# Patient Record
Sex: Female | Born: 1980 | Race: White | Hispanic: No | Marital: Married | Smoking: Former smoker
Health system: Southern US, Academic
[De-identification: ages and names within clinical notes are randomized; demographics above are authoritative.]

## PROBLEM LIST (undated history)

## (undated) DIAGNOSIS — F419 Anxiety disorder, unspecified: Secondary | ICD-10-CM

## (undated) DIAGNOSIS — J329 Chronic sinusitis, unspecified: Secondary | ICD-10-CM

## (undated) DIAGNOSIS — F909 Attention-deficit hyperactivity disorder, unspecified type: Secondary | ICD-10-CM

## (undated) DIAGNOSIS — F32A Depression, unspecified: Secondary | ICD-10-CM

## (undated) DIAGNOSIS — G35 Multiple sclerosis: Secondary | ICD-10-CM

## (undated) DIAGNOSIS — G43909 Migraine, unspecified, not intractable, without status migrainosus: Secondary | ICD-10-CM

## (undated) DIAGNOSIS — G35D Multiple sclerosis, unspecified: Secondary | ICD-10-CM

## (undated) HISTORY — PX: HX TONSILLECTOMY: SHX27

## (undated) HISTORY — DX: Multiple sclerosis, unspecified: G35.D

## (undated) HISTORY — DX: Anxiety disorder, unspecified: F41.9

## (undated) HISTORY — PX: SINUS SURGERY: SHX187

## (undated) HISTORY — DX: Migraine, unspecified, not intractable, without status migrainosus: G43.909

## (undated) HISTORY — DX: Multiple sclerosis: G35

## (undated) HISTORY — DX: Depression, unspecified: F32.A

## (undated) HISTORY — DX: Chronic sinusitis, unspecified: J32.9

## (undated) HISTORY — PX: HX HIP REPLACEMENT: SHX124

## (undated) HISTORY — PX: HX GALL BLADDER SURGERY/CHOLE: SHX55

## (undated) HISTORY — DX: Attention-deficit hyperactivity disorder, unspecified type: F90.9

---
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
## 2018-11-08 IMAGING — MR MRI BRAIN WITHOUT AND WITH CONTRAST
10 of 14 series · 34 of 48 positions shown · IV contrast (gadavist)
Comparison: None available.
COMPARISON: None available.

------------- REPORT [id number] -------------
Addendum Begin

Comparison has been made to a brain MRI report dated 05/06/2010. Previously reported 7 mm cyst deep to the insula on the left side is stable and is most consistent with a prominent perivascular space.
Addendum End
EXAM:  MRI BRAIN WITHOUT AND WITH CONTRAST
INDICATION: Headaches and visual disturbance, evaluate for multiple sclerosis.
TECHNIQUE: Multiplanar multisequential MRI of the brain was performed without and with 6 mL of Gadavist.

[Series 10: DWI · axial · 5.0mm · 1.35mm/px · z∈[-14,+112]mm · 8 of 88 slices shown (1 of 3)]
[im 1/88]
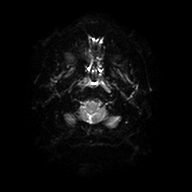
[im 10/88]
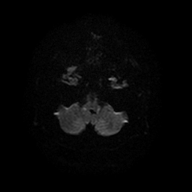
[im 30/88]
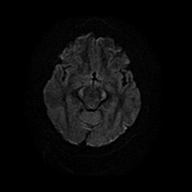
[im 39/88]
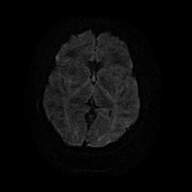
[im 49/88]
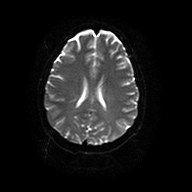
[im 59/88]
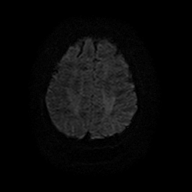
[im 78/88]
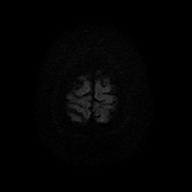
[im 88/88]
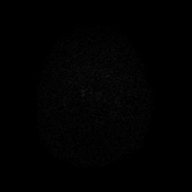

[Series 11: DWI · axial · 5.0mm · 1.35mm/px · z∈[-14,+112]mm · 3 of 22 slices shown (2 of 3)]
[im 1/22]
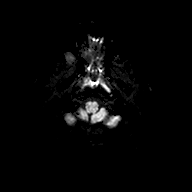
[im 11/22]
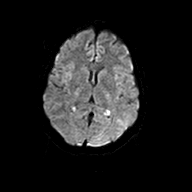
[im 22/22]
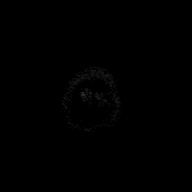

[Series 12: DWI · axial · 5.0mm · 1.35mm/px · z∈[-14,+112]mm · 2 of 22 slices shown (3 of 3)]
[im 1/22]
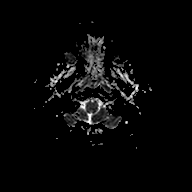
[im 22/22]
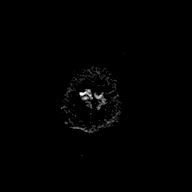

[Series 13: FLAIR · sagittal · 5.0mm · 0.75mm/px · 3 of 28 slices shown (1 of 2)]
[im 1/28]
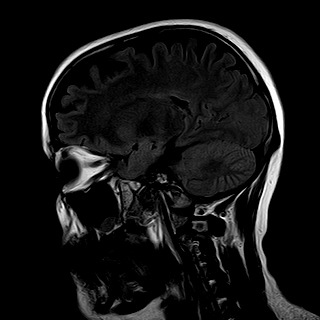
[im 14/28]
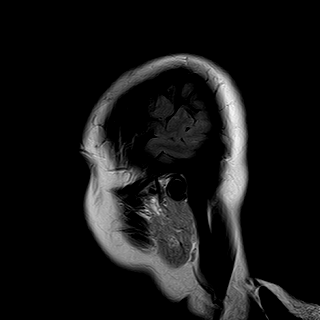
[im 28/28]
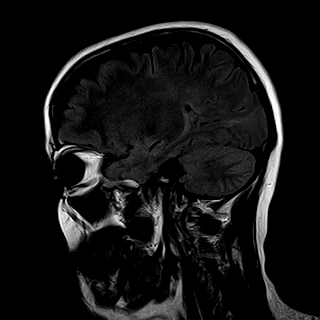

[Series 14: FLAIR · axial · 4.0mm · 0.43mm/px · z∈[-29,+125]mm · 3 of 36 slices shown (2 of 2)]
[im 1/36]
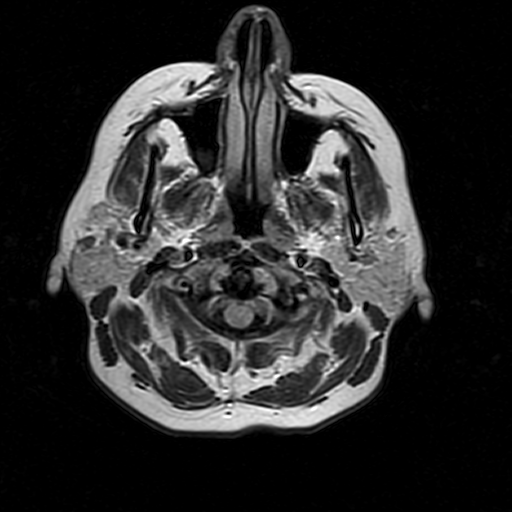
[im 18/36]
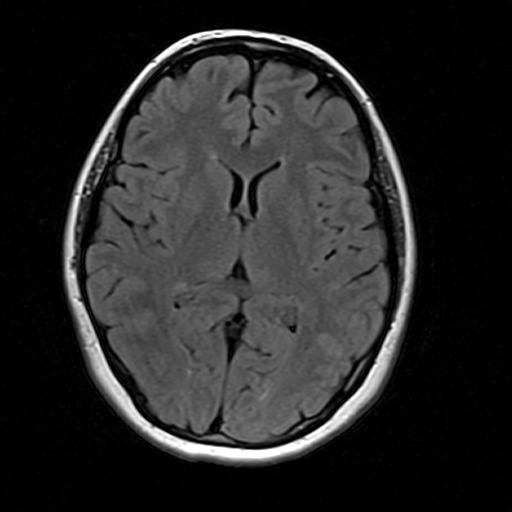
[im 36/36]
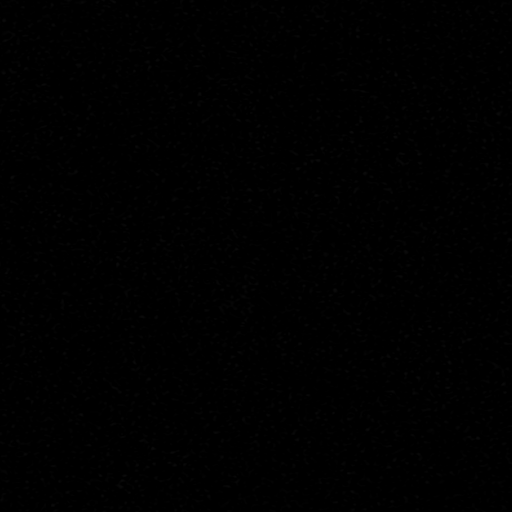

[Series 18: T1 · axial · 4.0mm · 0.43mm/px · z∈[-29,+125]mm · 3 of 36 slices shown]
[im 1/36]
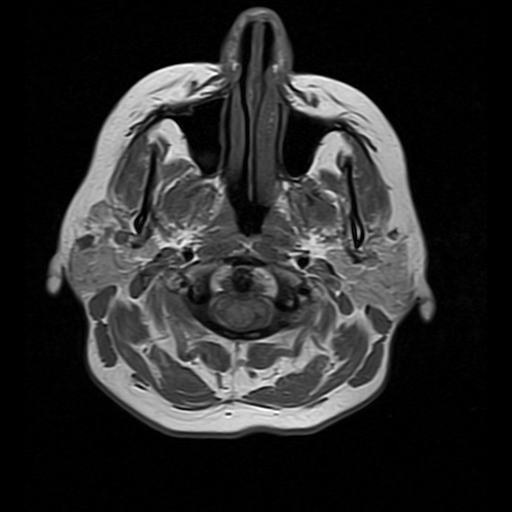
[im 18/36]
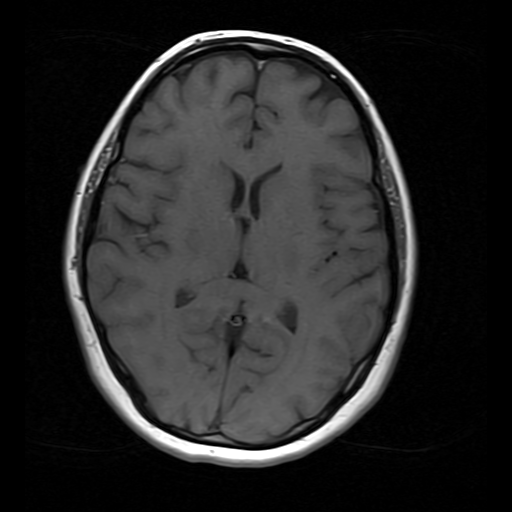
[im 36/36]
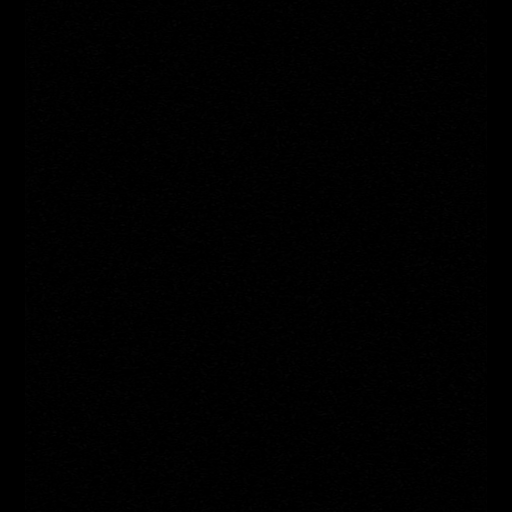

[Series 19: T2 · coronal · 5.0mm · 0.43mm/px · 3 of 28 slices shown]
[im 1/28]
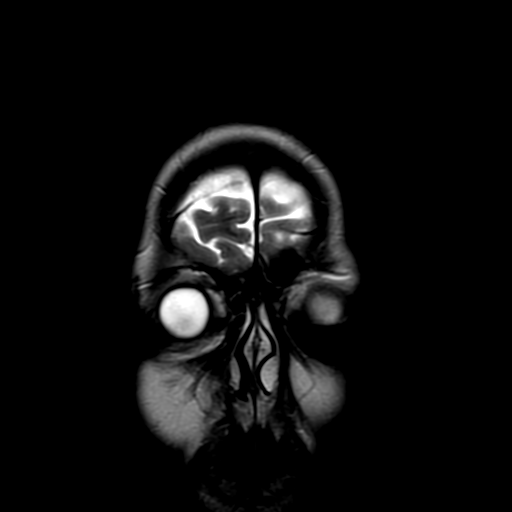
[im 14/28]
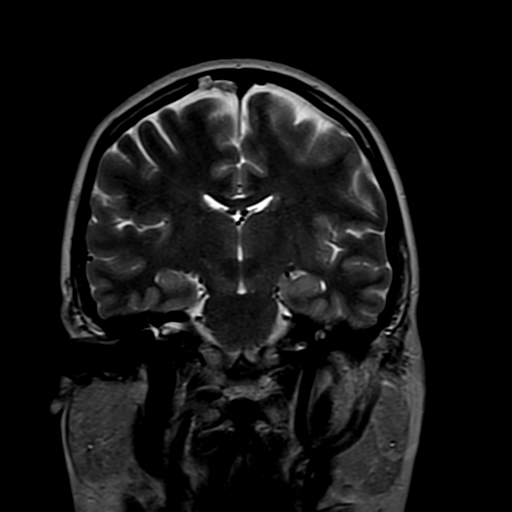
[im 28/28]
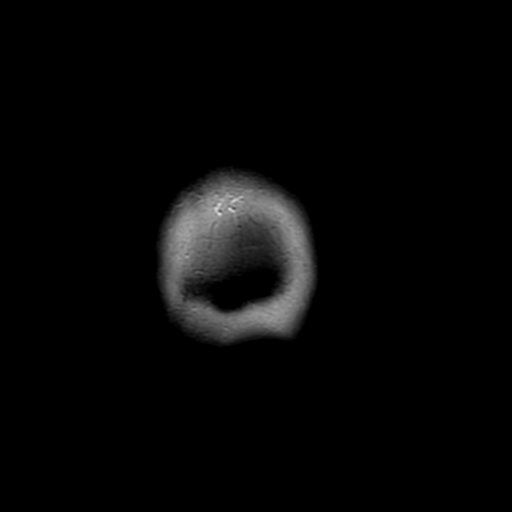

[Series 20: T1 fat-sat · coronal · 5.0mm · 0.57mm/px · 3 of 28 slices shown (1 of 2)]
[im 1/28]
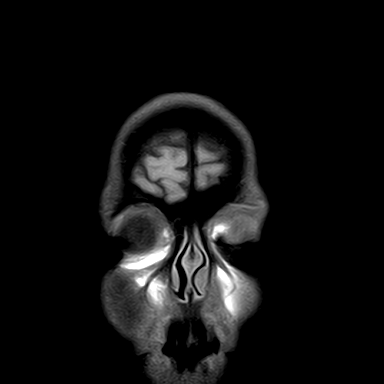
[im 14/28]
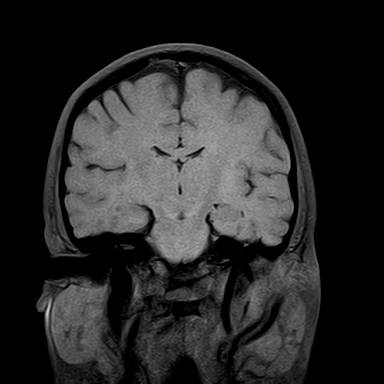
[im 28/28]
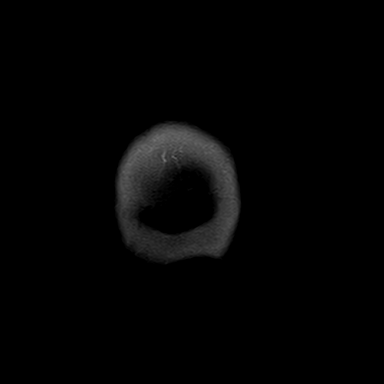

[Series 21: T1 fat-sat · axial · 4.0mm · 0.43mm/px · z∈[-29,+125]mm · 3 of 36 slices shown (2 of 2)]
[im 1/36]
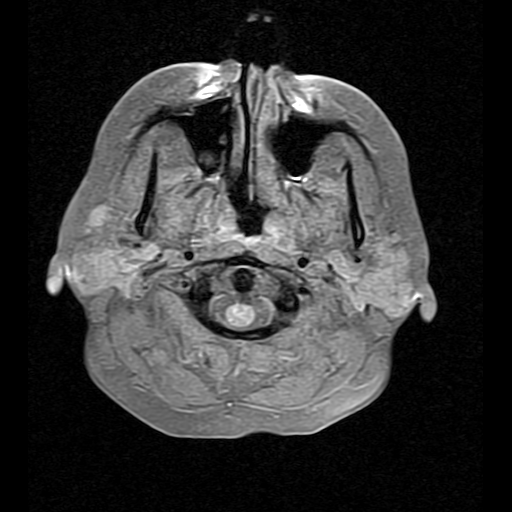
[im 18/36]
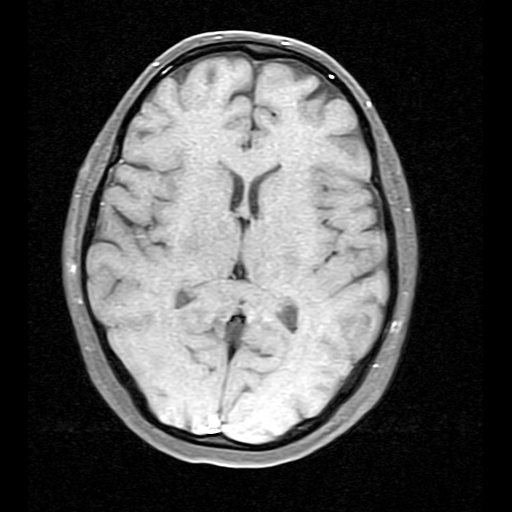
[im 36/36]
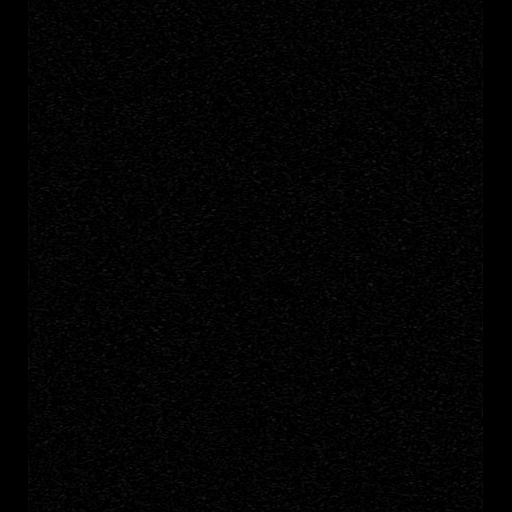

[Series 22: T1 fat-sat post-contrast · coronal · 5.0mm · 0.57mm/px · 3 of 28 slices shown]
[im 1/28]
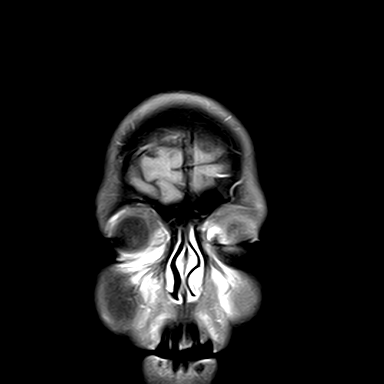
[im 14/28]
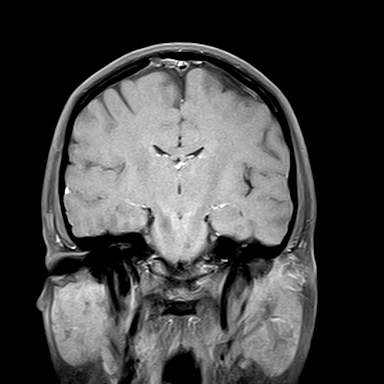
[im 28/28]
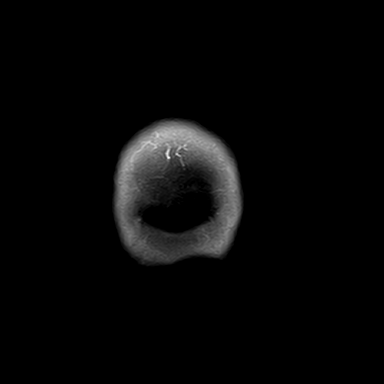

[34 of 48 positions shown; findings below may reference images not displayed]

FINDINGS: Ventricular and sulcal size is normal for the patient's age. There is no mass effect, midline shift or intracranial hemorrhage. There is no evidence of acute infarction or prior microhemorrhages. Skull base flow voids and basal cisterns are patent. Sagittal survey of midline structures is unremarkable. Following intravenous contrast administration, there is no abnormal parenchymal or leptomeningeal enhancement. There are no extra-axial fluid collections. Visualized paranasal sinuses, mastoid air cells and orbital contents are unremarkable.
IMPRESSION: Unremarkable exam.

------------- REPORT [id number] -------------
EXAM:  MRI BRAIN WITHOUT AND WITH CONTRAST
FINDINGS: Ventricular and sulcal size is normal for the patient's age. There is no mass effect, midline shift or intracranial hemorrhage. There is no evidence of acute infarction or prior microhemorrhages. Skull base flow voids and basal cisterns are patent. Sagittal survey of midline structures is unremarkable. Following intravenous contrast administration, there is no abnormal parenchymal or leptomeningeal enhancement. There are no extra-axial fluid collections. Visualized paranasal sinuses, mastoid air cells and orbital contents are unremarkable.
IMPRESSION: Unremarkable exam.

## 2018-11-08 IMAGING — MR MRI CERVICAL SPINE WITHOUT AND WITH CONTRAST
7 of 9 series · 33 of 48 positions shown · IV contrast (gadavist)
Comparison: None available.

EXAM:  MRI CERVICAL SPINE WITHOUT AND WITH CONTRAST
INDICATION: Headaches and visual disturbance. Evaluate for multiple sclerosis.
TECHNIQUE: Multiplanar multisequential MRI of the cervical spine was performed without and with 6 mL of Gadavist.

[Series 5: T2 · sagittal · 3.0mm · 0.75mm/px · 3 of 13 slices shown (1 of 2)]
[im 1/13]
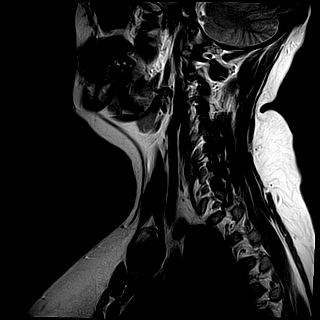
[im 7/13]
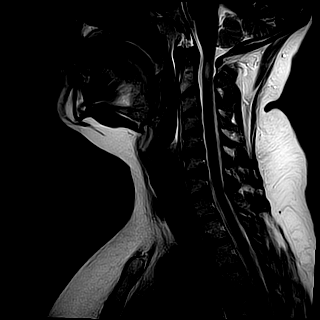
[im 13/13]
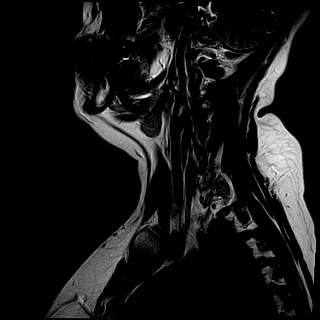

[Series 9: T1 · sagittal · 3.0mm · 0.47mm/px · 4 of 13 slices shown]
[im 1/13]
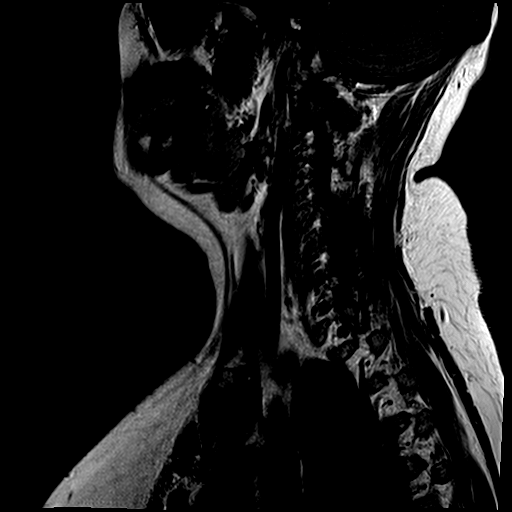
[im 5/13]
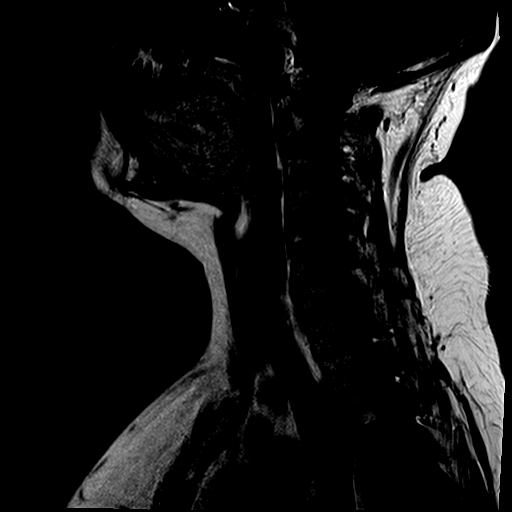
[im 9/13]
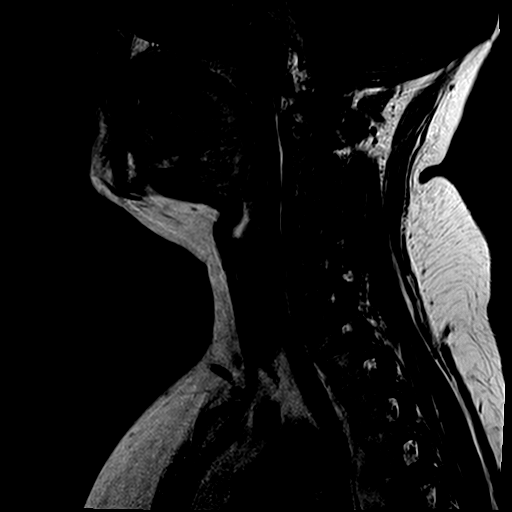
[im 13/13]
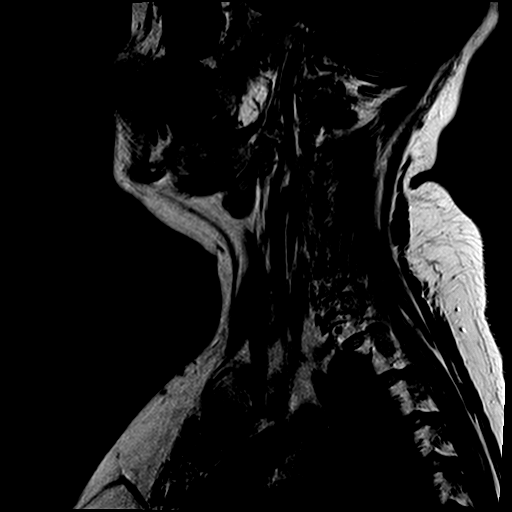

[Series 10: T1 fat-sat · sagittal · 3.0mm · 0.75mm/px · 4 of 13 slices shown (1 of 2)]
[im 1/13]
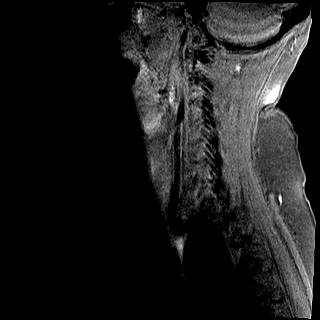
[im 5/13]
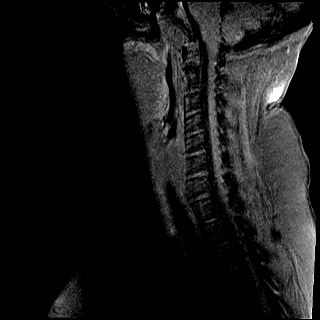
[im 9/13]
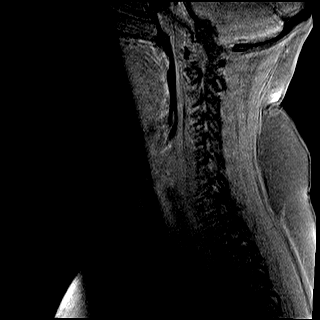
[im 13/13]
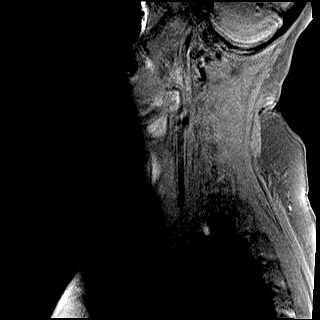

[Series 12: T2 · axial · 3.5mm · 0.35mm/px · z∈[-54,+57]mm · 8 of 26 slices shown (2 of 2)]
[im 1/26]
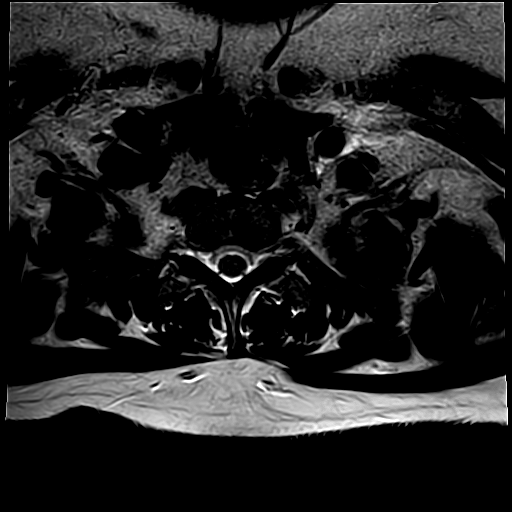
[im 4/26]
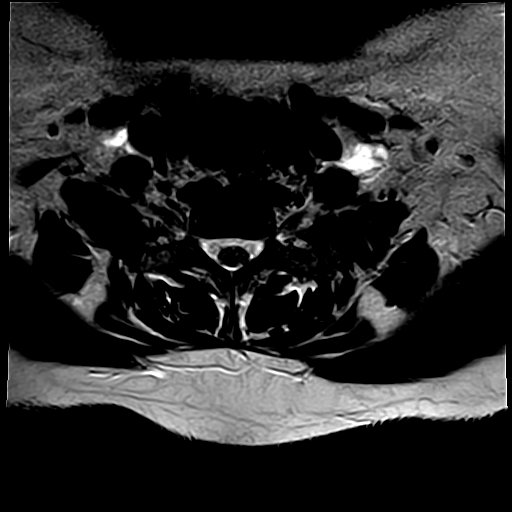
[im 8/26]
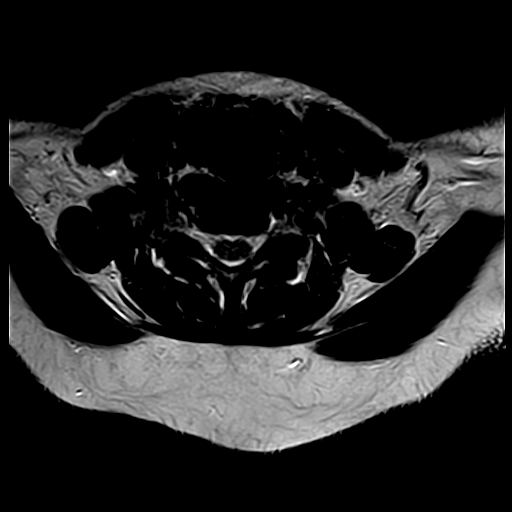
[im 11/26]
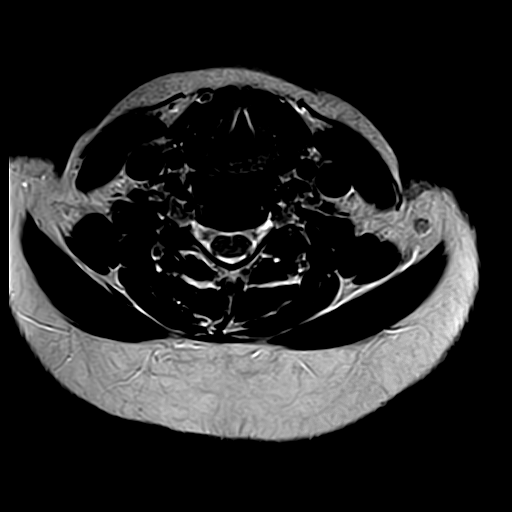
[im 15/26]
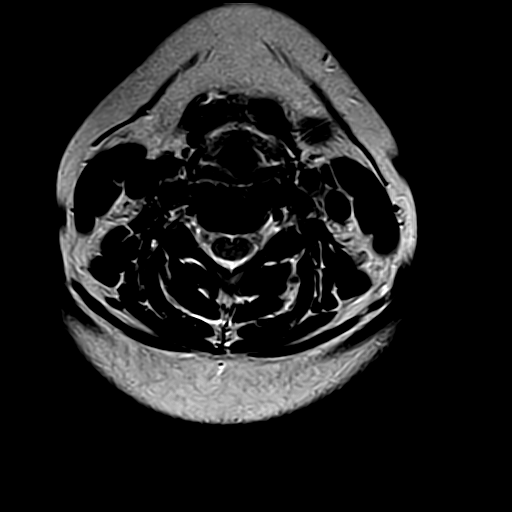
[im 18/26]
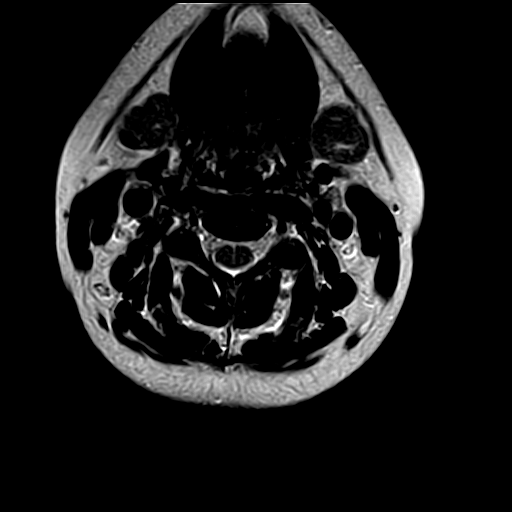
[im 22/26]
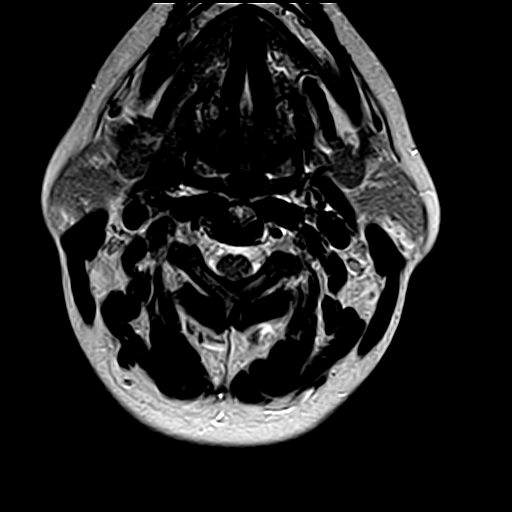
[im 26/26]
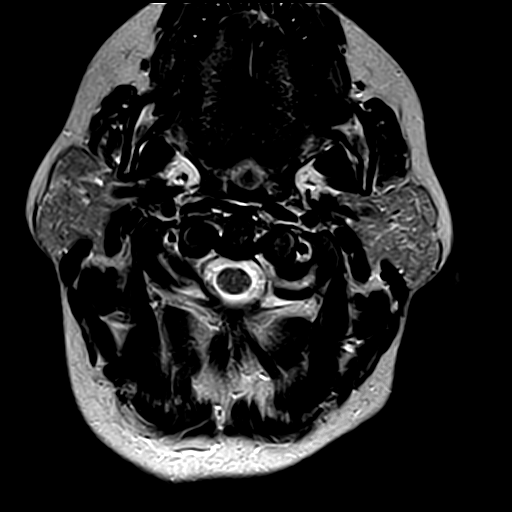

[Series 13: T1 fat-sat · axial · 3.5mm · 0.70mm/px · z∈[-54,+57]mm · 8 of 26 slices shown (2 of 2)]
[im 1/26]
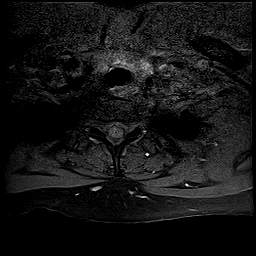
[im 4/26]
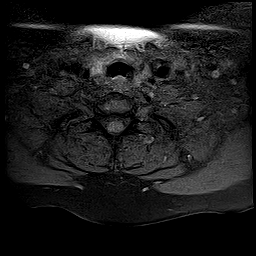
[im 8/26]
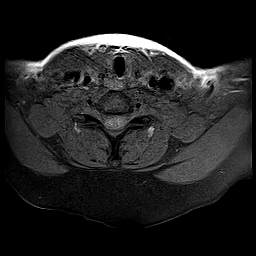
[im 11/26]
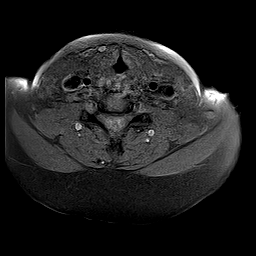
[im 15/26]
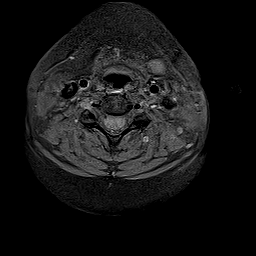
[im 18/26]
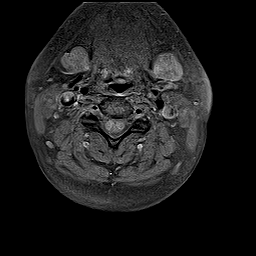
[im 22/26]
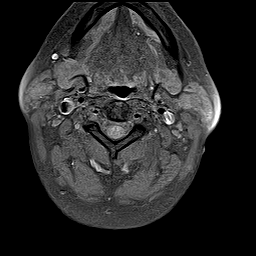
[im 26/26]
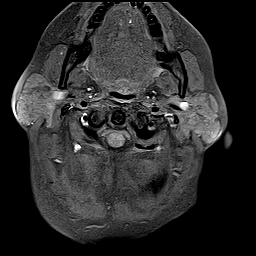

[Series 17: T1 fat-sat post-contrast · sagittal · 3.0mm · 0.75mm/px · 4 of 13 slices shown (1 of 2)]
[im 1/13]
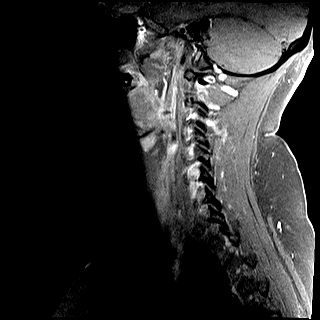
[im 5/13]
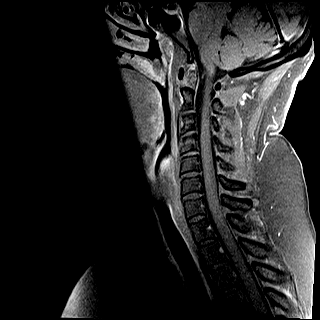
[im 9/13]
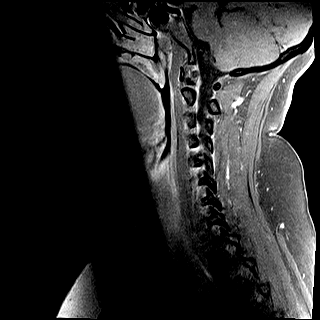
[im 13/13]
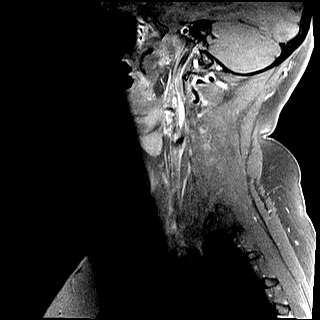

[Series 18: T1 fat-sat post-contrast · axial · 3.5mm · 0.70mm/px · z∈[-62,-49]mm · 2 of 26 slices shown (2 of 2)]
[im 1/26]
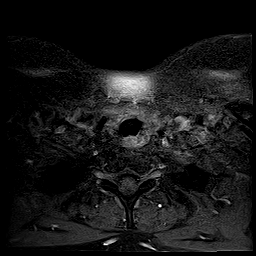
[im 4/26]
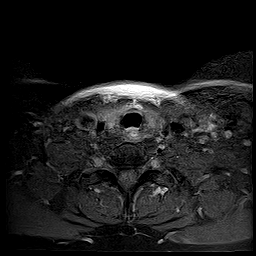

[33 of 48 positions shown; findings below may reference images not displayed]

FINDINGS: Vertebral bodies are normal in height, alignment and signal intensity. There is no acute fracture or subluxation. There is a 12.5 mm demyelinating plaque within the left aspect of the spinal cord at the level of C2 vertebral body.

There is no significant disc herniation, spinal canal or neural foraminal stenosis at any level.

Following intravenous contrast administration, there is no abnormal spinal cord or paraspinal soft tissue enhancement.
IMPRESSION: A 12.5 mm demyelinating plaque within the spinal cord at the level of C2 vertebral body. No evidence of active demyelination.

## 2020-01-14 IMAGING — MR MRI BRAIN WITHOUT AND WITH CONTRAST
10 of 14 series · 34 of 48 positions shown · IV contrast (gadavist)
Comparison: Prior study dated 11/08/2018.

EXAM:  MRI BRAIN WITHOUT AND WITH CONTRAST
INDICATION: MS followup.
TECHNIQUE: An MRI is performed with and without contrast material, 5 mL Gadavist.  Multiple axial, coronal and sagittal images are obtained using T1, T2, and diffusion-weighted imaging.  Postgadolinium T1-weighted images are obtained in the axial and coronal planes.

[Series 10: DWI · axial · 5.0mm · 1.35mm/px · z∈[-45,+81]mm · 8 of 88 slices shown (1 of 3)]
[im 1/88]
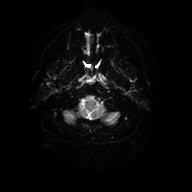
[im 10/88]
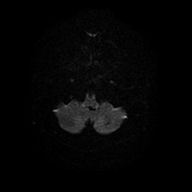
[im 30/88]
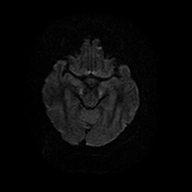
[im 39/88]
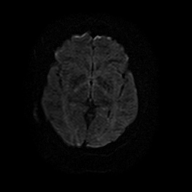
[im 49/88]
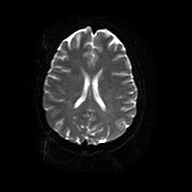
[im 59/88]
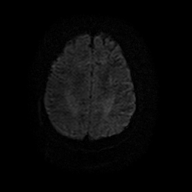
[im 78/88]
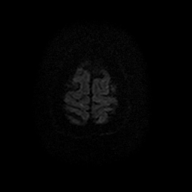
[im 88/88]
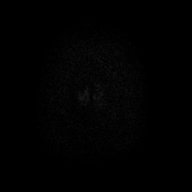

[Series 11: DWI · axial · 5.0mm · 1.35mm/px · z∈[-45,+81]mm · 3 of 22 slices shown (2 of 3)]
[im 1/22]
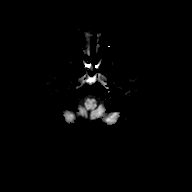
[im 11/22]
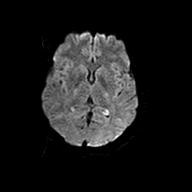
[im 22/22]
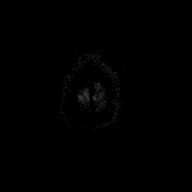

[Series 12: DWI · axial · 5.0mm · 1.35mm/px · z∈[-45,+81]mm · 2 of 22 slices shown (3 of 3)]
[im 1/22]
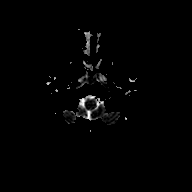
[im 22/22]
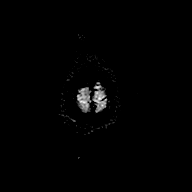

[Series 13: FLAIR · sagittal · 5.0mm · 0.75mm/px · 3 of 28 slices shown (1 of 2)]
[im 1/28]
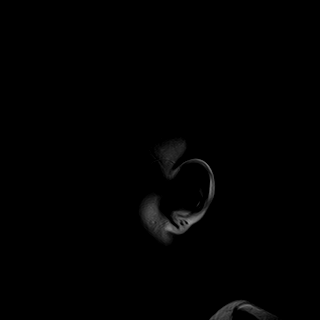
[im 14/28]
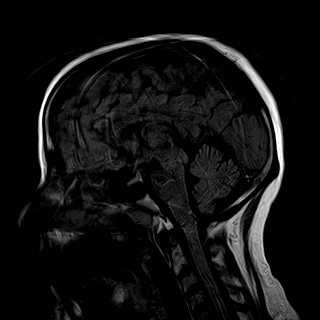
[im 28/28]
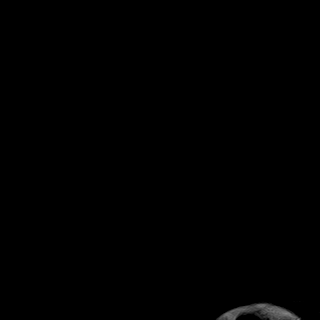

[Series 17: T1 · axial · 4.0mm · 0.43mm/px · z∈[-59,+95]mm · 3 of 36 slices shown]
[im 1/36]
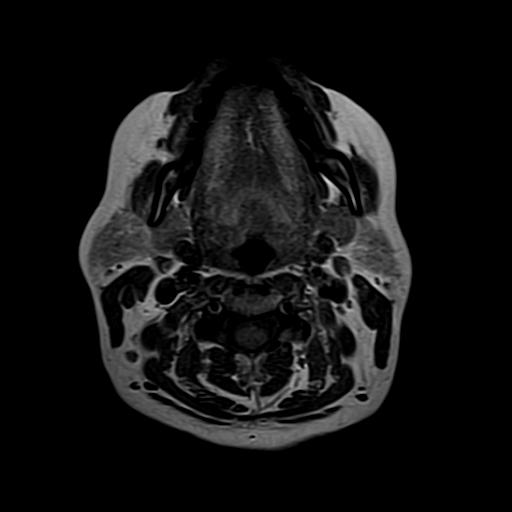
[im 18/36]
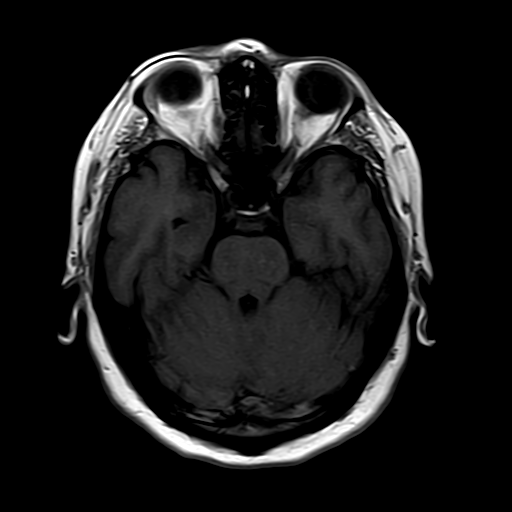
[im 36/36]
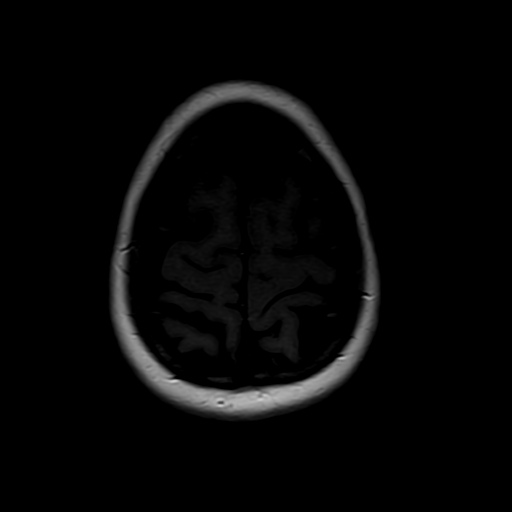

[Series 18: T1 fat-sat · axial · 4.0mm · 0.43mm/px · z∈[-59,+95]mm · 3 of 36 slices shown (1 of 2)]
[im 1/36]
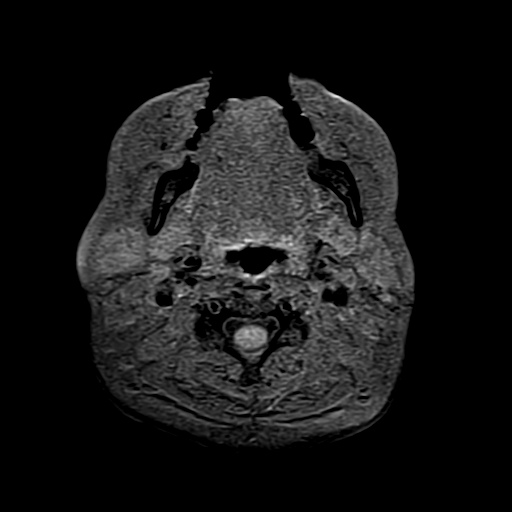
[im 18/36]
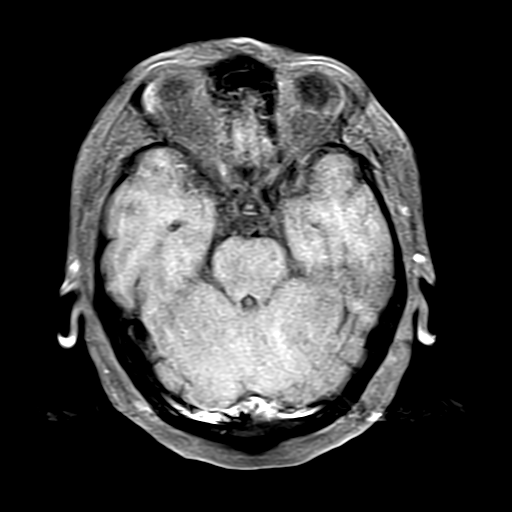
[im 36/36]
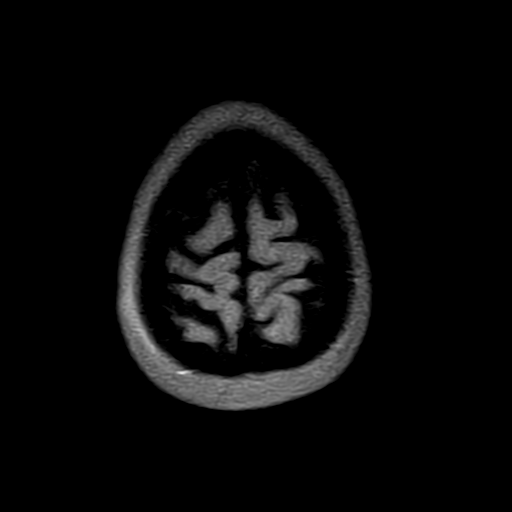

[Series 19: FLAIR · axial · 4.0mm · 0.43mm/px · z∈[-59,+95]mm · 3 of 36 slices shown (2 of 2)]
[im 1/36]
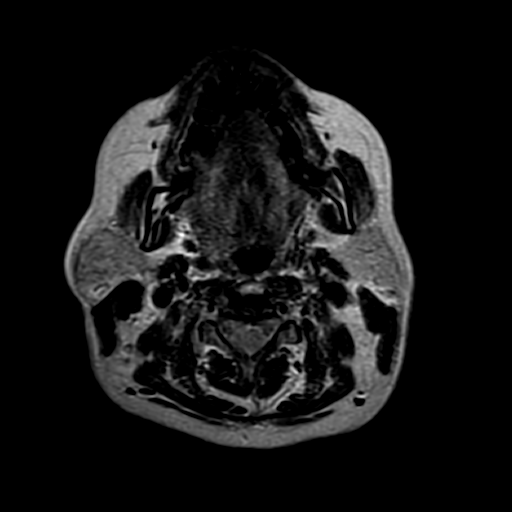
[im 18/36]
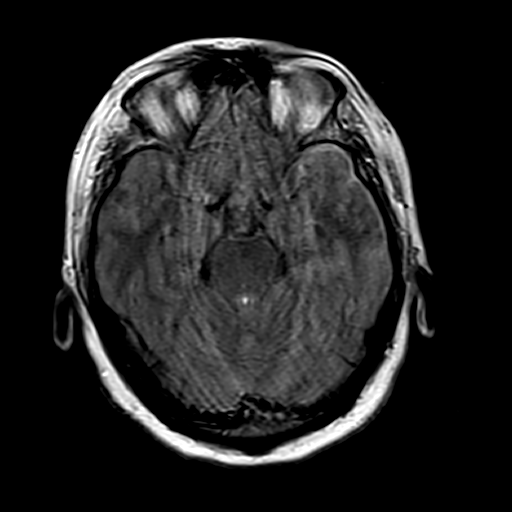
[im 36/36]
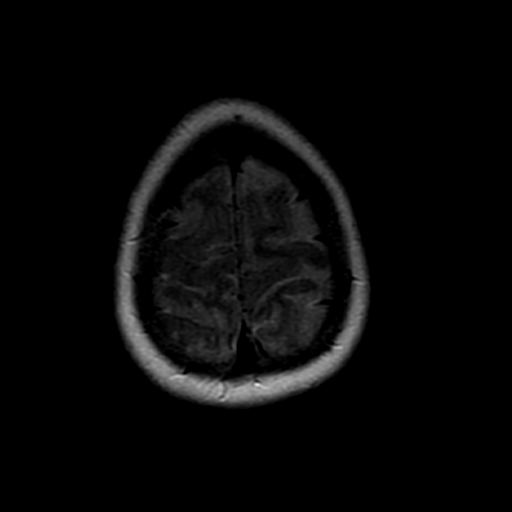

[Series 20: T2 · coronal · 5.0mm · 0.43mm/px · 3 of 28 slices shown]
[im 1/28]
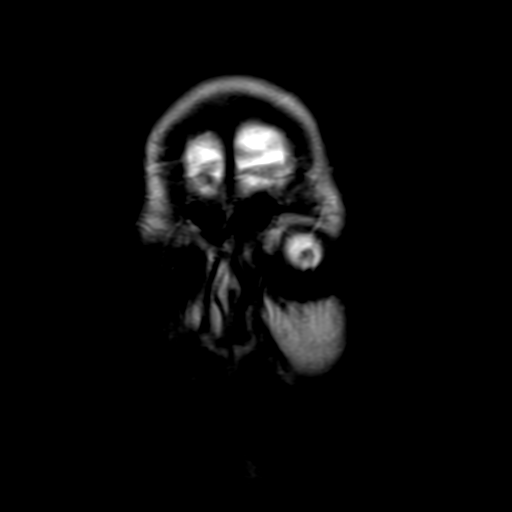
[im 14/28]
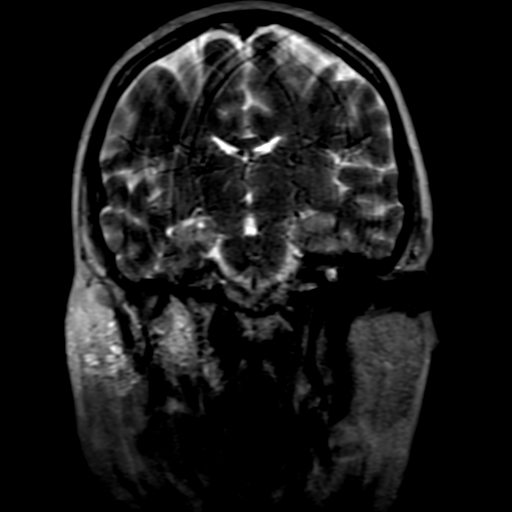
[im 28/28]
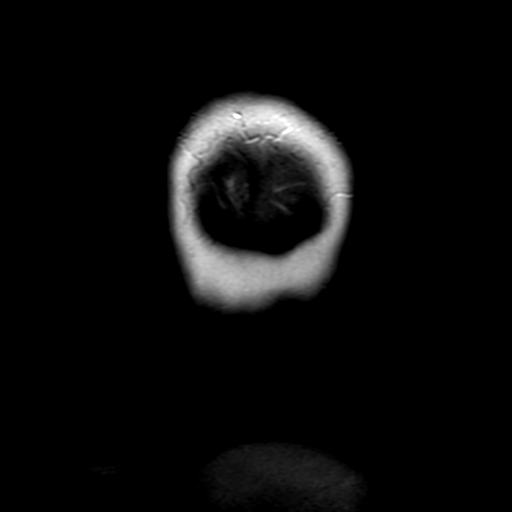

[Series 21: T1 fat-sat · coronal · 5.0mm · 0.57mm/px · 3 of 28 slices shown (2 of 2)]
[im 1/28]
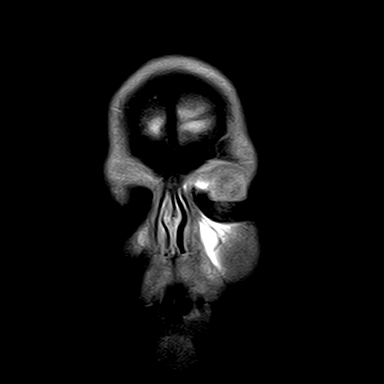
[im 14/28]
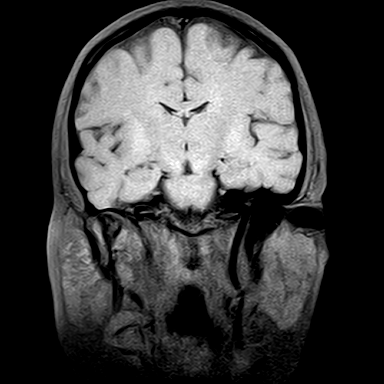
[im 28/28]
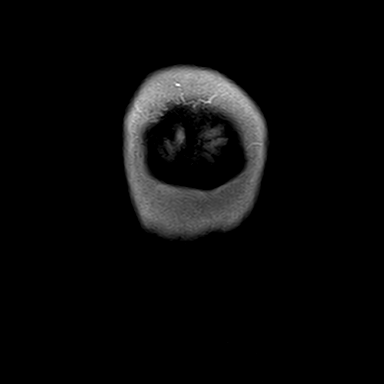

[Series 26: T1 fat-sat post-contrast · coronal · 5.0mm · 0.57mm/px · 3 of 28 slices shown]
[im 1/28]
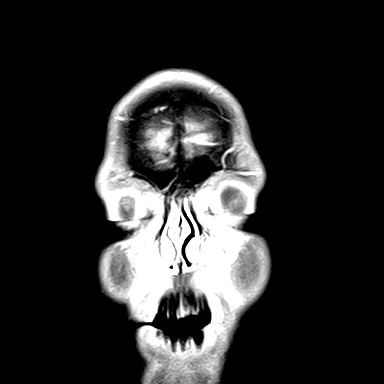
[im 14/28]
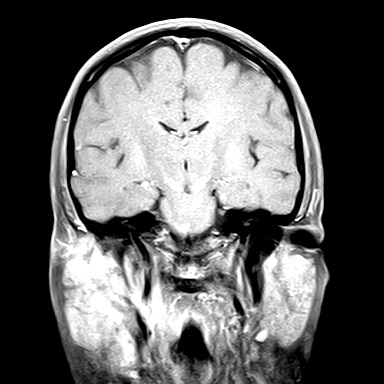
[im 28/28]
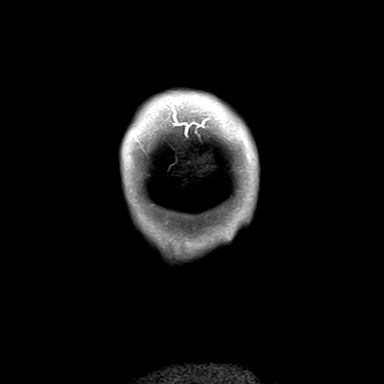

[34 of 48 positions shown; findings below may reference images not displayed]

FINDINGS: The current exam is limited due to patient motion artifact.  The diffusion-weighted imaging shows no evidence for restricted diffusion.  Within the limits of the visualization on the motion images, no evidence for a mass effect, midline shift or edema in the brain parenchyma is observed.  No evidence for a hematoma is seen within the brain parenchyma.  Gadolinium shows no evidence for an enhancing lesion.
IMPRESSION: Limited study due to patient motion artifact.

No evidence for an acute hemorrhage, mass effect, or midline shift.  

No focal abnormal T2 hyperintensities in the white matter tracts.

No evidence for restricted diffusion.

No enhancing lesion after gadolinium administration.

## 2020-01-14 IMAGING — MR MRI CERVICAL SPINE WITHOUT AND WITH CONTRAST
10 series · 48 of 48 positions shown · IV contrast (gadavist)
Comparison: 11/08/2018

EXAM:  MRI CERVICAL SPINE WITHOUT AND WITH CONTRAST
INDICATION: Followup MS.
TECHNIQUE: An MRI is performed both with and without intravenous contrast, 5 mL Gadavist.  Multiple images are obtained in the sagittal, axial and coronal planes using T1 and T2-weighted scanning protocols.  After contrast administration, T1-weighted axial and sagittal images are obtained.

[Series 4: s-map · sagittal · 8.8mm · 4.38mm/px · 13 of 100 slices shown]
[im 1/100]
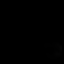
[im 9/100]
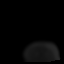
[im 17/100]
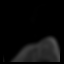
[im 25/100]
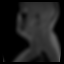
[im 34/100]
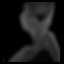
[im 42/100]
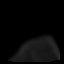
[im 50/100]
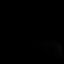
[im 58/100]
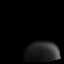
[im 67/100]
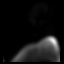
[im 75/100]
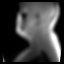
[im 83/100]
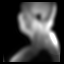
[im 91/100]
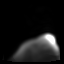
[im 100/100]
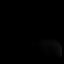

[Series 6: T2 · sagittal · 3.0mm · 0.75mm/px · 1 of 13 slices shown (1 of 2)]
[im 1/13]
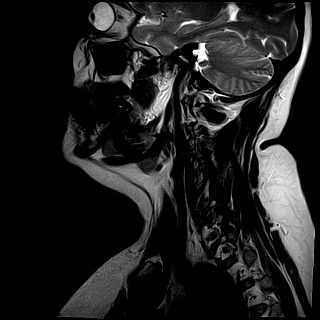

[Series 10: T1 · sagittal · 3.0mm · 0.47mm/px · 2 of 13 slices shown]
[im 1/13]
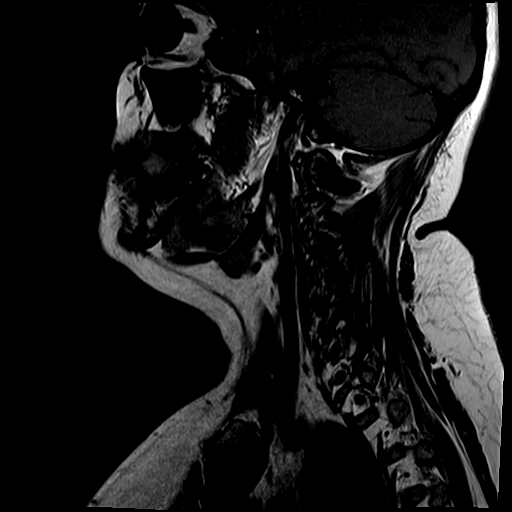
[im 13/13]
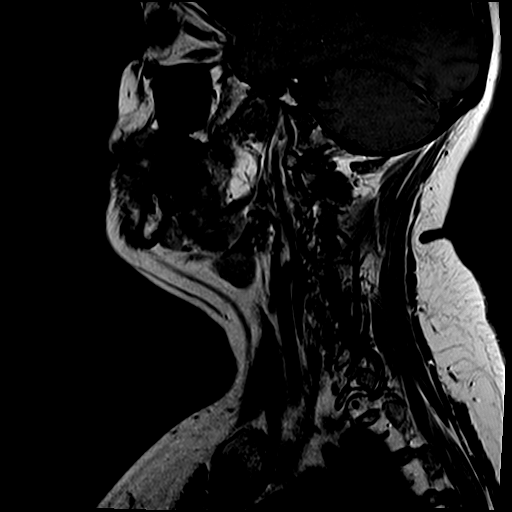

[Series 11: T1 fat-sat · sagittal · 3.0mm · 0.75mm/px · 2 of 13 slices shown (1 of 2)]
[im 1/13]
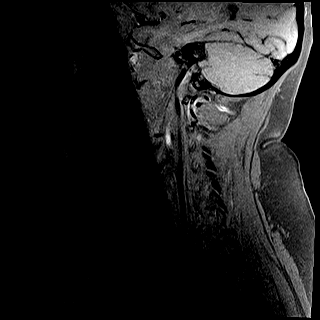
[im 13/13]
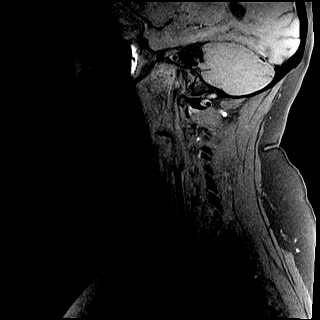

[Series 12: STIR · sagittal · 3.0mm · 0.47mm/px · 2 of 13 slices shown]
[im 1/13]
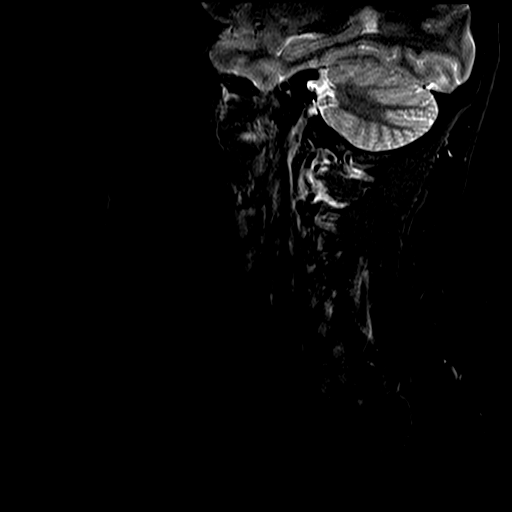
[im 13/13]
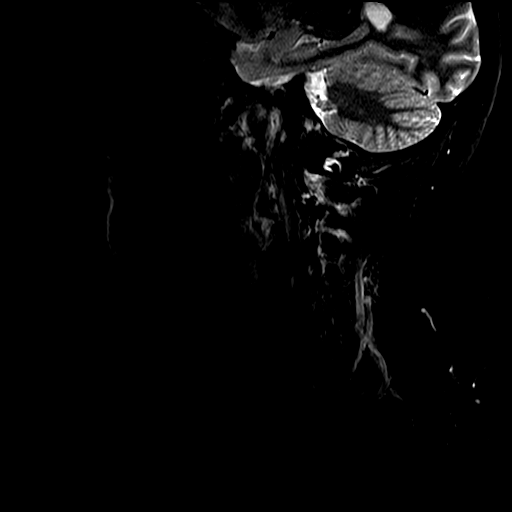

[Series 13: T2 · axial · 3.5mm · 0.35mm/px · z∈[-90,+21]mm · 4 of 26 slices shown (2 of 2)]
[im 1/26]
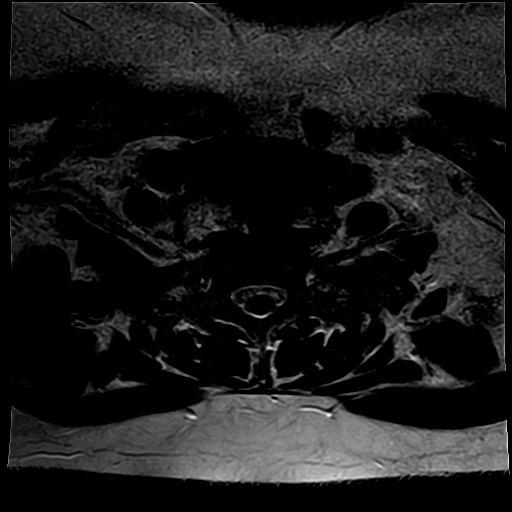
[im 9/26]
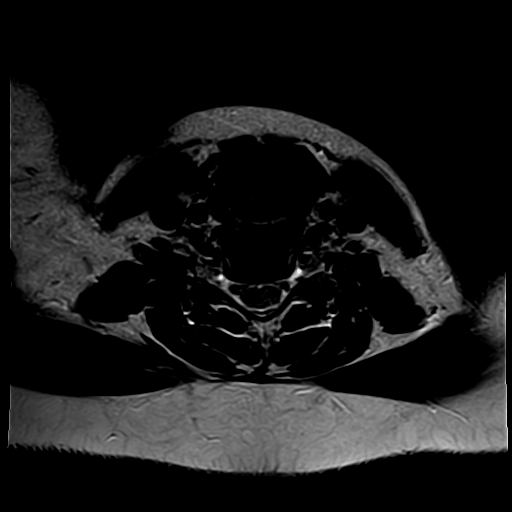
[im 17/26]
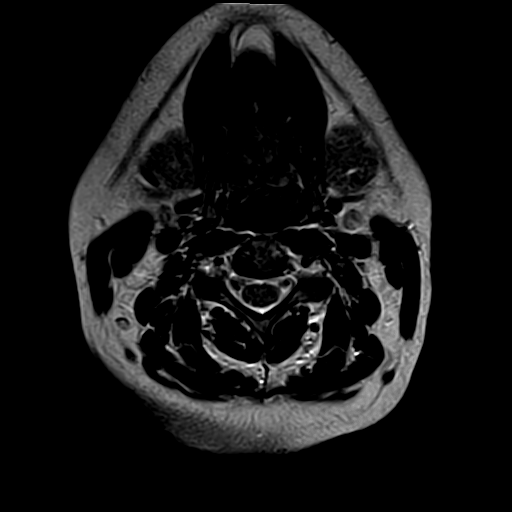
[im 26/26]
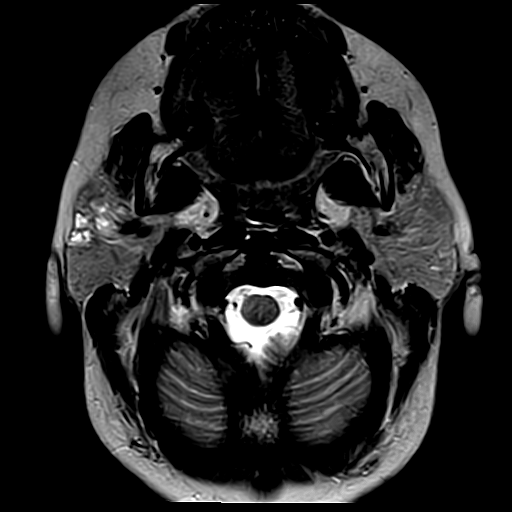

[Series 14: T1 fat-sat · axial · 3.5mm · 0.70mm/px · z∈[-90,+21]mm · 4 of 26 slices shown (2 of 2)]
[im 1/26]
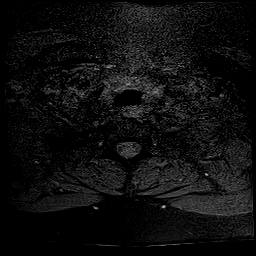
[im 9/26]
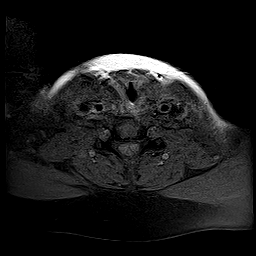
[im 17/26]
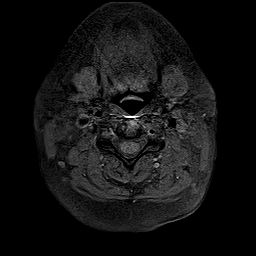
[im 26/26]
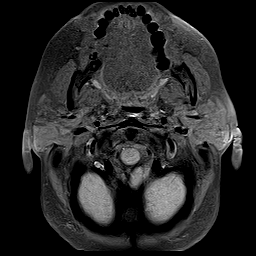

[Series 18: (id) · sagittal · 8.8mm · 4.38mm/px · 14 of 100 slices shown]
[im 1/100]
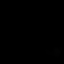
[im 8/100]
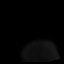
[im 16/100]
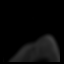
[im 23/100]
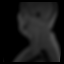
[im 31/100]
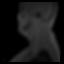
[im 39/100]
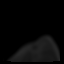
[im 46/100]
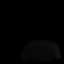
[im 54/100]
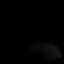
[im 61/100]
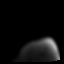
[im 69/100]
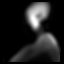
[im 77/100]
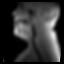
[im 84/100]
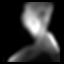
[im 92/100]
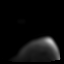
[im 100/100]
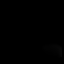

[Series 19: T1 fat-sat post-contrast · sagittal · 3.0mm · 0.75mm/px · 2 of 13 slices shown (1 of 2)]
[im 1/13]
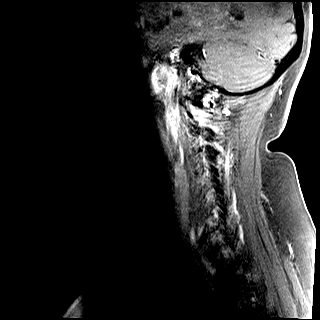
[im 13/13]
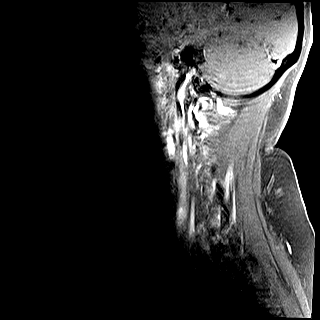

[Series 20: T1 fat-sat post-contrast · axial · 3.5mm · 0.70mm/px · z∈[-87,+24]mm · 4 of 26 slices shown (2 of 2)]
[im 1/26]
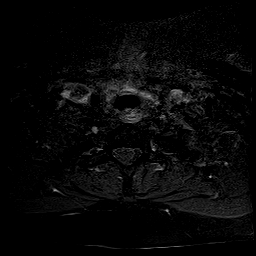
[im 9/26]
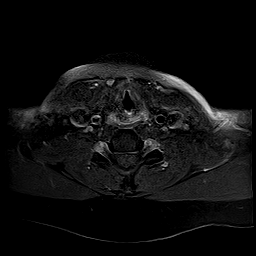
[im 17/26]
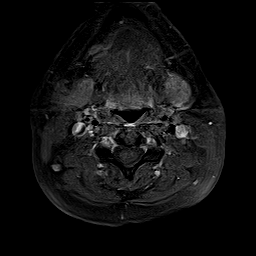
[im 26/26]
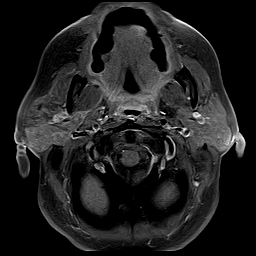

[48 of 48 positions shown; findings below may reference images not displayed]

FINDINGS: Examination shows a 12.5 mm demyelinating plaque on the left side of the spinal cord at the level of C2.  This was reported on the previous study dated 11/08/2018.  No change is identified.  No other demyelinating plaques are observed.  

With the administration of contrast material, no enhancement is observed.  No enhancing lesions in the brainstem or the remainder of the cervical cord is noted.
IMPRESSION: Stable demyelinating plaque at the level of C2 and the cervical cord. 

No evidence for active demyelination.

No new plaques or changes since the previous study.

## 2021-03-23 ENCOUNTER — Other Ambulatory Visit (HOSPITAL_COMMUNITY): Payer: Self-pay

## 2021-03-23 IMAGING — MR MRI THORACIC SPINE WITHOUT AND WITH CONTRAST
6 of 8 series · 34 of 48 positions shown · IV contrast (gadavist)
Comparison: None available.

﻿EXAM:  [id number]   MRI THORACIC SPINE WITHOUT AND WITH CONTRAST
INDICATION: History of multiple sclerosis.
TECHNIQUE: Multiplanar multisequential MRI of the thoracic spine was performed without and with 5 mL of Gadavist.

[Series 5: T2 · sagittal · 4.0mm · 0.78mm/px · 5 of 13 slices shown (1 of 2)]
[im 1/13]
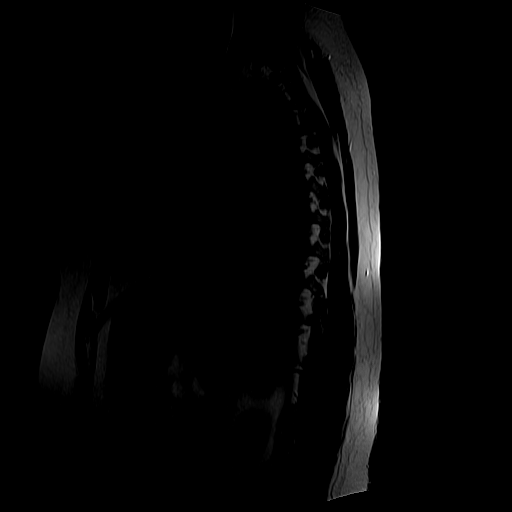
[im 4/13]
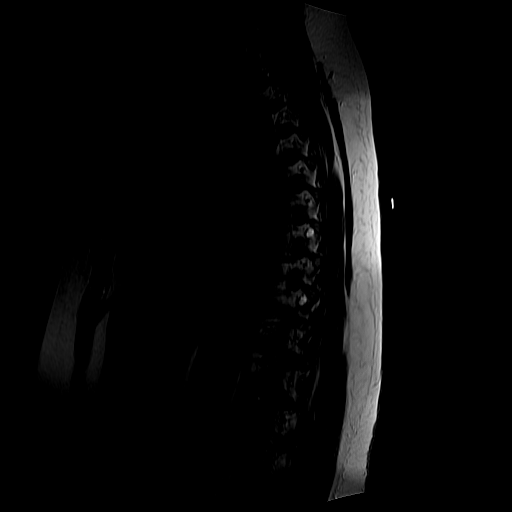
[im 7/13]
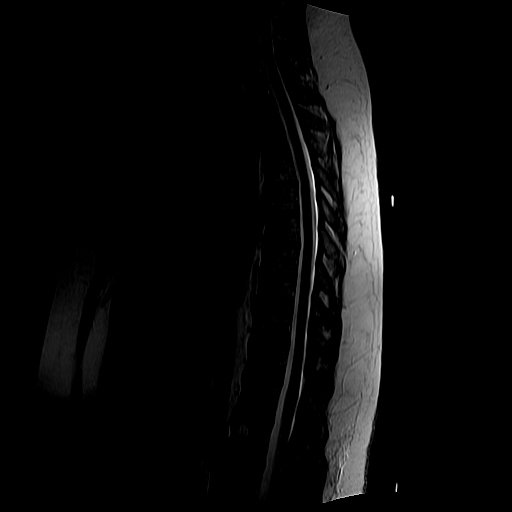
[im 10/13]
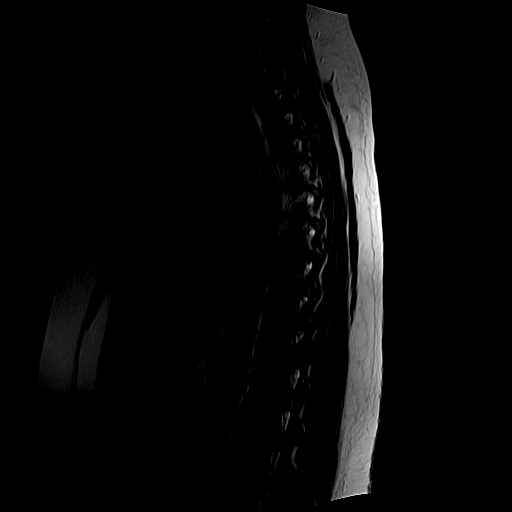
[im 13/13]
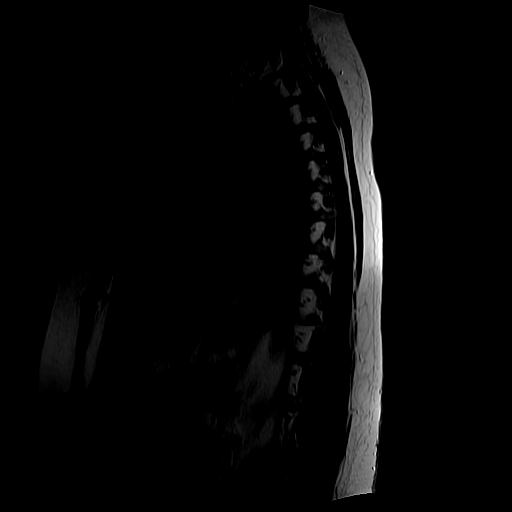

[Series 6: T1 · sagittal · 4.0mm · 0.78mm/px · 4 of 13 slices shown (1 of 2)]
[im 1/13]
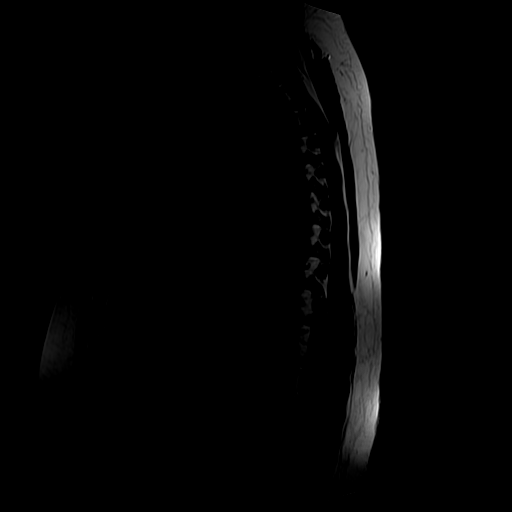
[im 5/13]
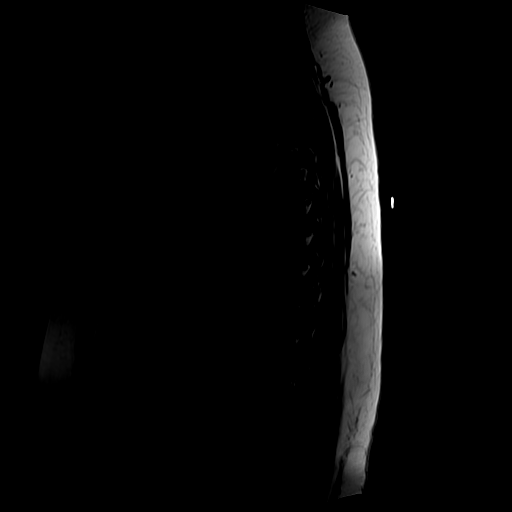
[im 9/13]
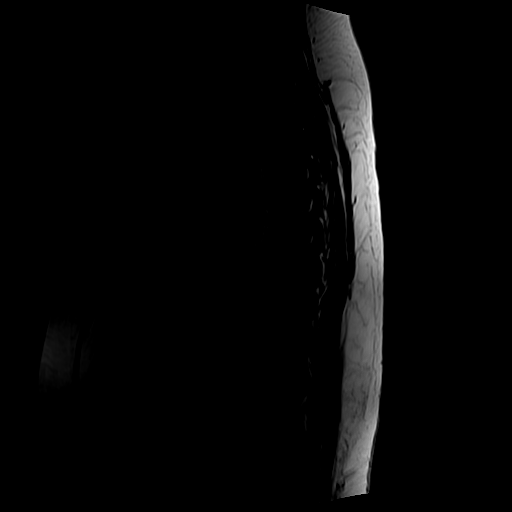
[im 13/13]
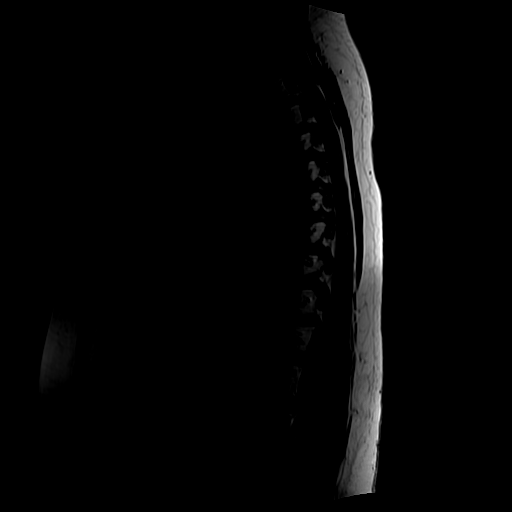

[Series 14: T2 · axial · 10.0mm · 0.62mm/px · z∈[-366,-69]mm · 9 of 28 slices shown (2 of 2)]
[im 1/28]
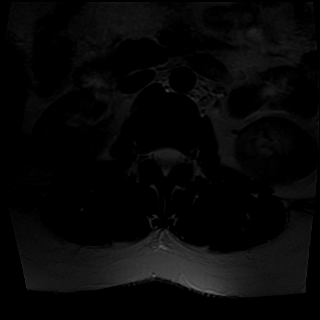
[im 4/28]
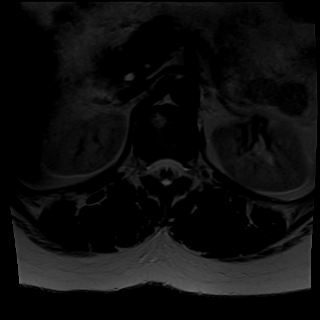
[im 7/28]
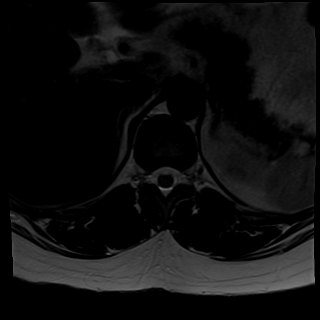
[im 11/28]
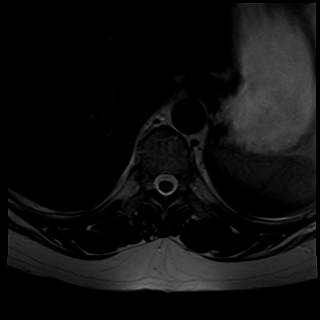
[im 14/28]
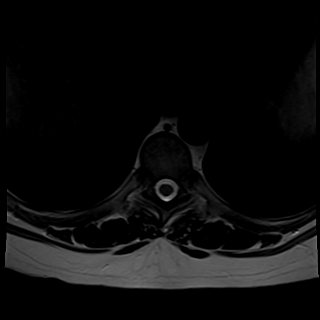
[im 17/28]
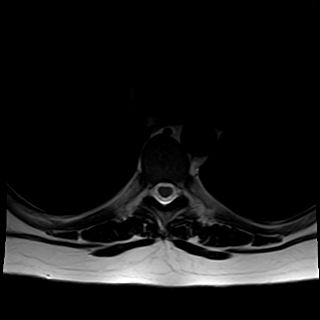
[im 21/28]
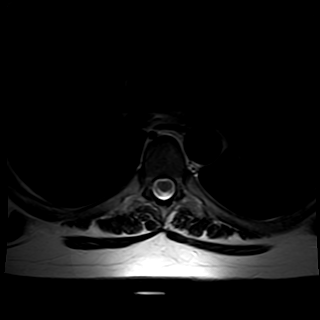
[im 24/28]
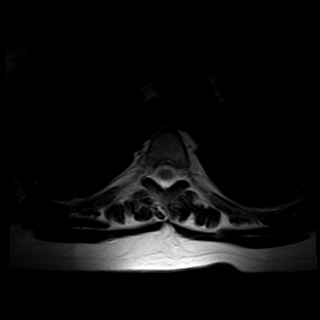
[im 28/28]
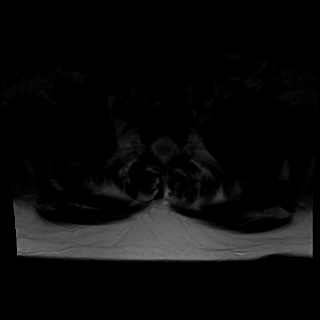

[Series 15: T1 fat-sat · axial · 10.0mm · 0.78mm/px · z∈[-366,-69]mm · 9 of 28 slices shown (1 of 2)]
[im 1/28]
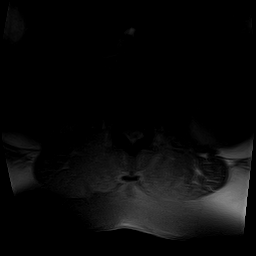
[im 4/28]
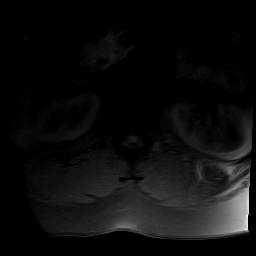
[im 7/28]
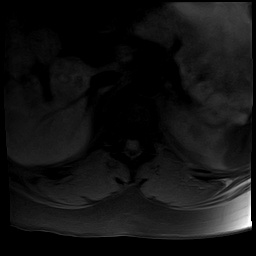
[im 11/28]
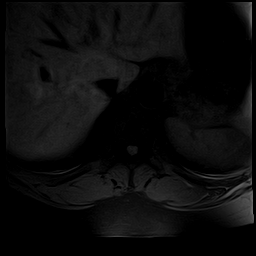
[im 14/28]
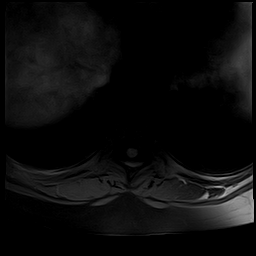
[im 17/28]
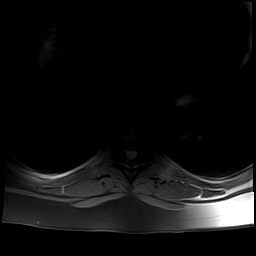
[im 21/28]
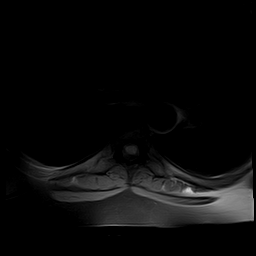
[im 24/28]
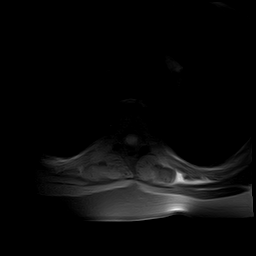
[im 28/28]
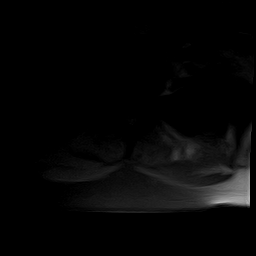

[Series 16: T1 fat-sat · sagittal · 4.0mm · 1.04mm/px · 4 of 13 slices shown (2 of 2)]
[im 1/13]
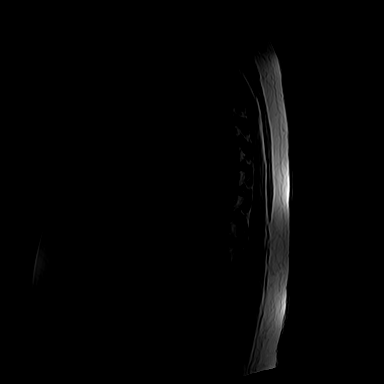
[im 5/13]
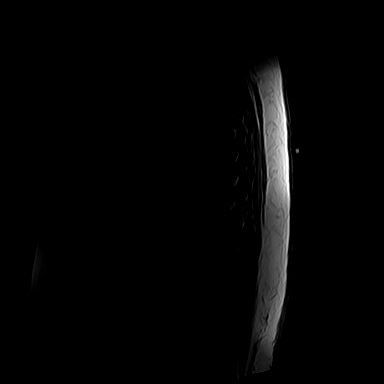
[im 9/13]
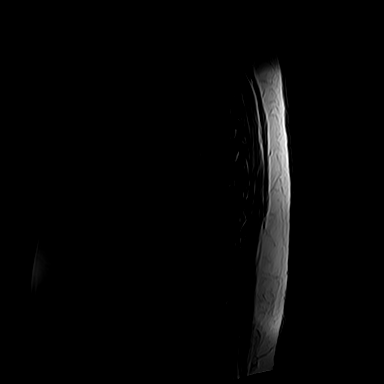
[im 13/13]
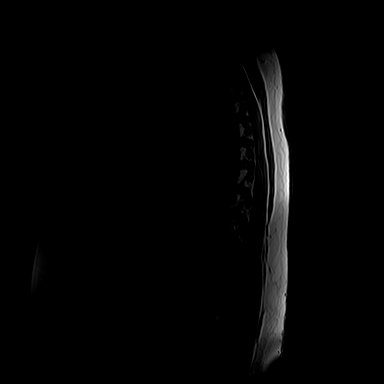

[Series 17: T1 · sagittal · 4.0mm · 1.04mm/px · 3 of 13 slices shown (2 of 2)]
[im 1/13]
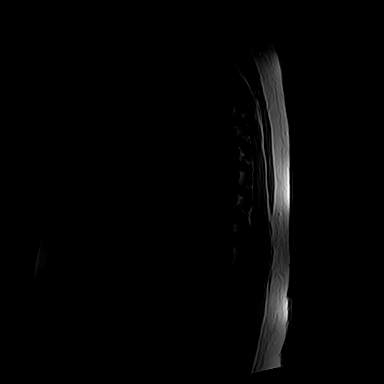
[im 5/13]
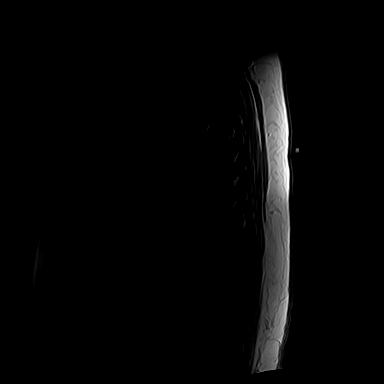
[im 9/13]
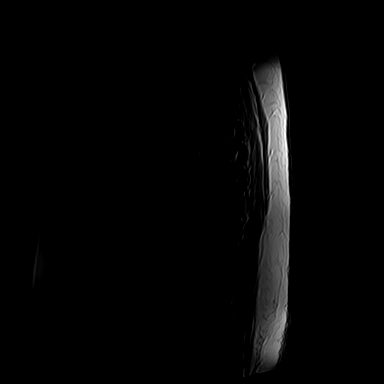

[34 of 48 positions shown; findings below may reference images not displayed]

FINDINGS: Vertebral bodies are normal in height, alignment and signal intensity. There is no acute fracture or subluxation. Visualized spinal cord is also normal in signal intensity without evidence of demyelinating disease or cord compression at any level.

There is no significant disc herniation, spinal canal or neural foraminal stenosis at any level.

Paraspinal soft tissues are also unremarkable. Following intravenous contrast administration, there is no abnormal spinal cord or paraspinal soft tissue enhancement. There is no pleural effusion.
IMPRESSION: Unremarkable exam.

## 2021-03-30 ENCOUNTER — Other Ambulatory Visit (HOSPITAL_COMMUNITY): Payer: Self-pay

## 2021-03-30 IMAGING — MR MRI BRAIN WITHOUT AND WITH CONTRAST
10 of 15 series · 32 of 48 positions shown · IV contrast (gadavist)
Comparison: Brain MRI dated 01/14/2020.

﻿EXAM:  MRI BRAIN WITHOUT AND WITH CONTRAST
INDICATION: History of multiple sclerosis.
TECHNIQUE: Multiplanar multisequential MRI of the brain was performed without and with 5 mL of Gadavist.

[Series 5: DWI · axial · 5.0mm · 1.35mm/px · z∈[-36,+90]mm · 8 of 88 slices shown (1 of 3)]
[im 1/88]
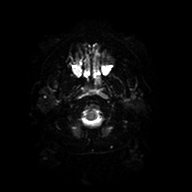
[im 13/88]
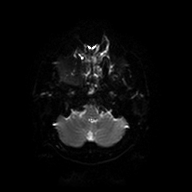
[im 25/88]
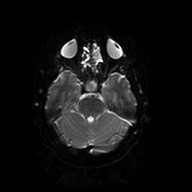
[im 38/88]
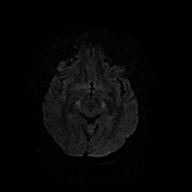
[im 50/88]
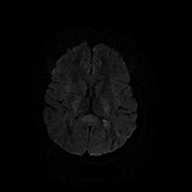
[im 63/88]
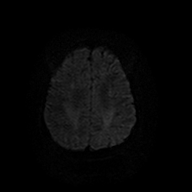
[im 75/88]
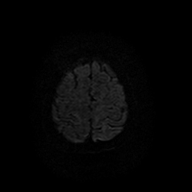
[im 88/88]
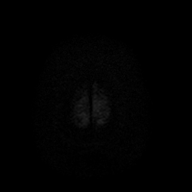

[Series 6: DWI · axial · 5.0mm · 1.35mm/px · z∈[-36,+90]mm · 2 of 22 slices shown (2 of 3)]
[im 1/22]
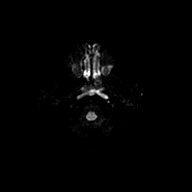
[im 22/22]
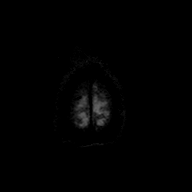

[Series 7: DWI · axial · 5.0mm · 1.35mm/px · z∈[-36,+90]mm · 2 of 19 slices shown (3 of 3)]
[im 1/19]
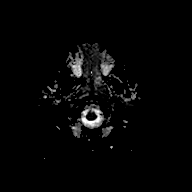
[im 19/19]
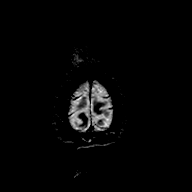

[Series 8: FLAIR · sagittal · 5.0mm · 0.75mm/px · 3 of 28 slices shown (1 of 2)]
[im 1/28]
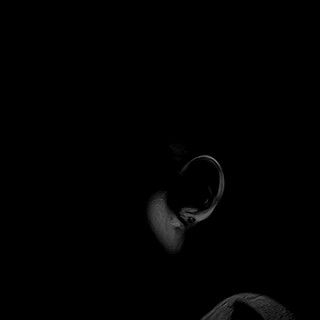
[im 14/28]
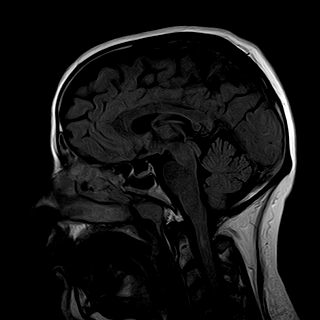
[im 28/28]
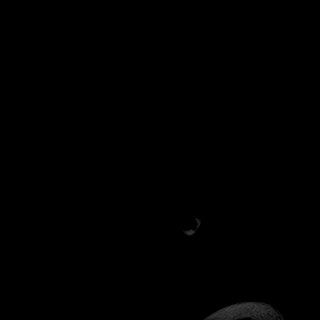

[Series 12: T1 · axial · 4.0mm · 0.43mm/px · z∈[-44,+110]mm · 3 of 36 slices shown]
[im 1/36]
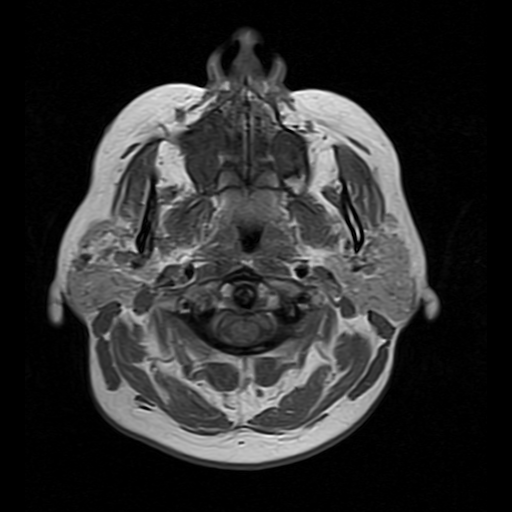
[im 18/36]
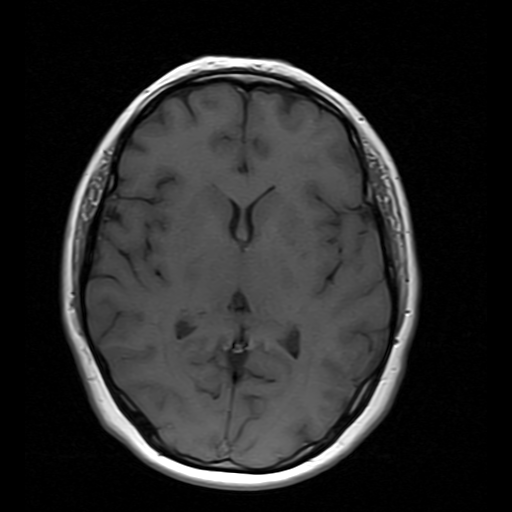
[im 36/36]
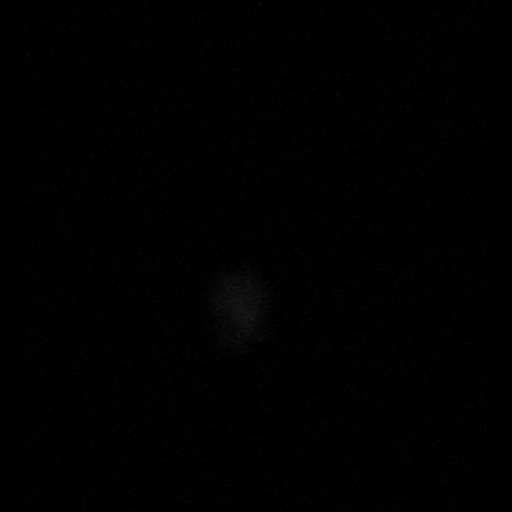

[Series 13: T2 · coronal · 5.0mm · 0.43mm/px · 3 of 28 slices shown]
[im 1/28]
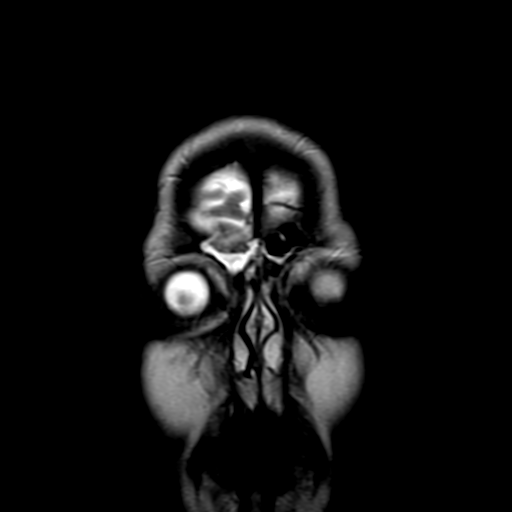
[im 14/28]
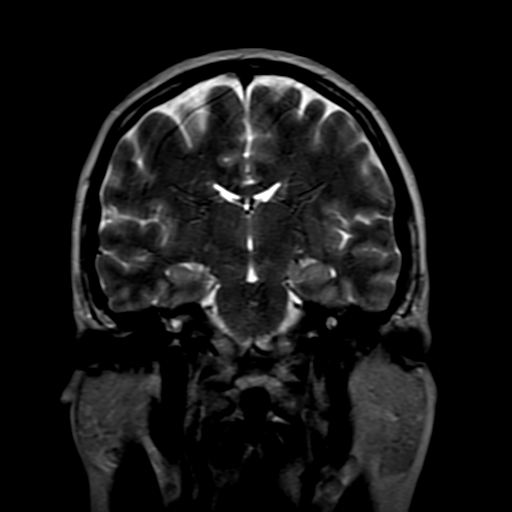
[im 28/28]
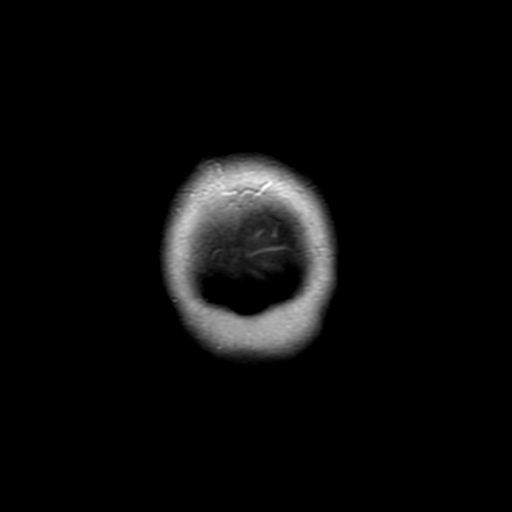

[Series 14: T1 fat-sat · coronal · 5.0mm · 0.57mm/px · 3 of 28 slices shown (1 of 2)]
[im 1/28]
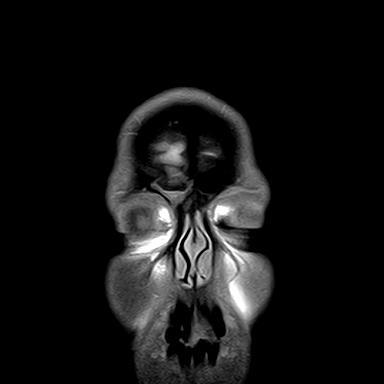
[im 14/28]
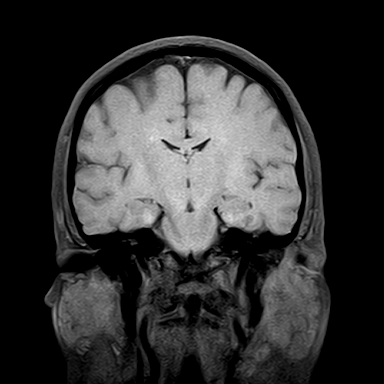
[im 28/28]
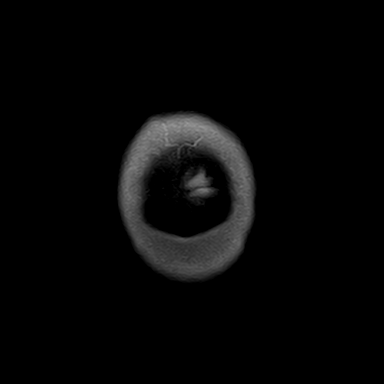

[Series 15: T1 fat-sat · axial · 4.0mm · 0.43mm/px · z∈[-44,+31]mm · 2 of 36 slices shown (2 of 2)]
[im 1/36]
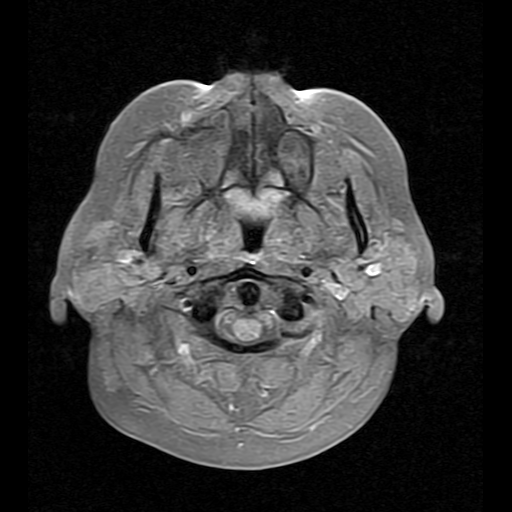
[im 18/36]
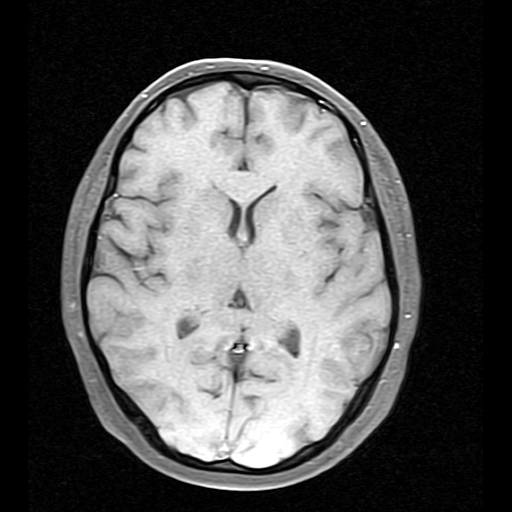

[Series 16: FLAIR · axial · 4.0mm · 0.43mm/px · z∈[-44,+110]mm · 3 of 36 slices shown (2 of 2)]
[im 1/36]
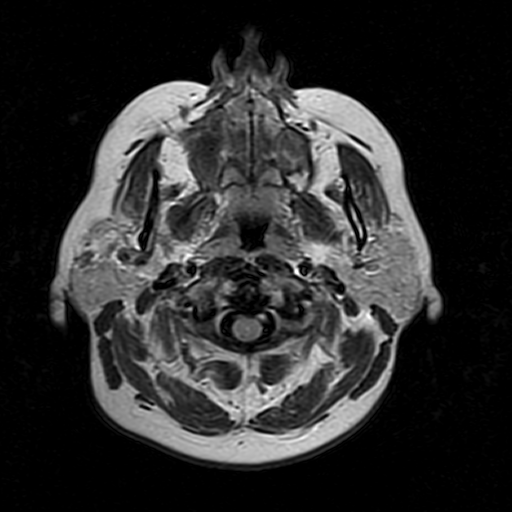
[im 18/36]
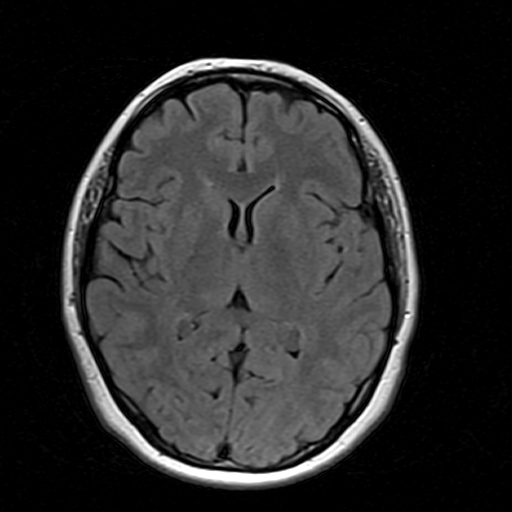
[im 36/36]
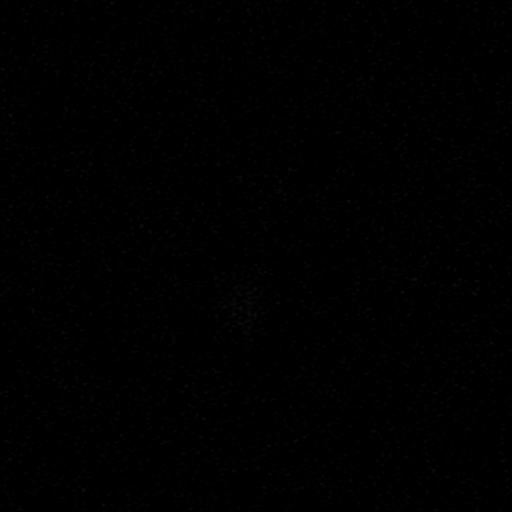

[Series 19: T1 post-contrast · axial · 4.0mm · 0.43mm/px · z∈[-44,+110]mm · 3 of 36 slices shown]
[im 1/36]
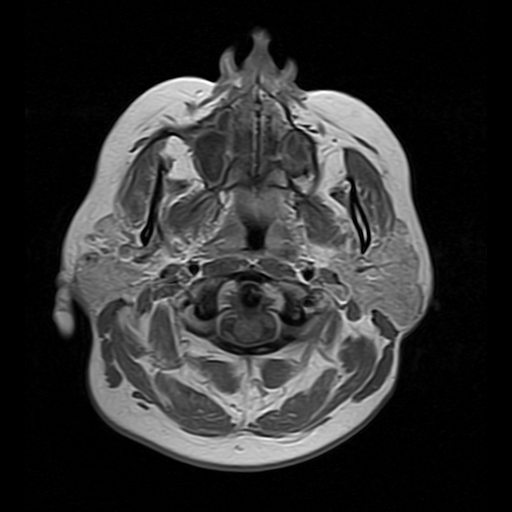
[im 18/36]
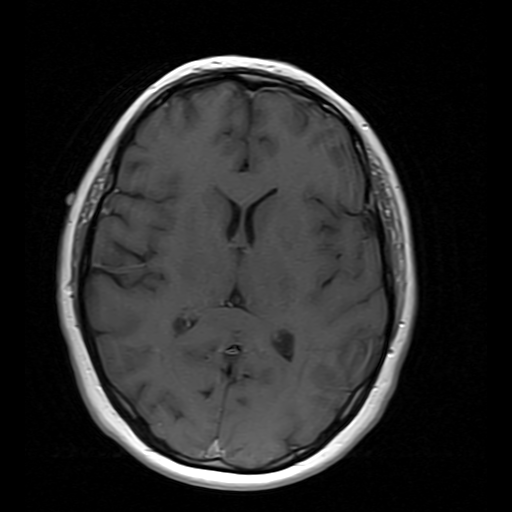
[im 36/36]
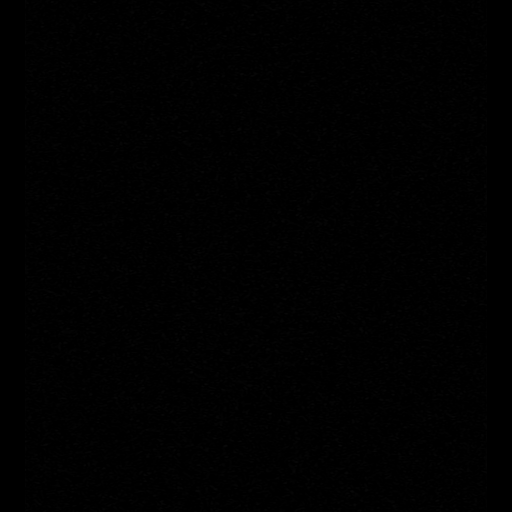

[32 of 48 positions shown; findings below may reference images not displayed]

FINDINGS: Ventricular and sulcal size is normal for the patient's age. No significant chronic ischemic changes or demyelinating plaques are identified There is no mass effect, midline shift or intracranial hemorrhage. There is no evidence of acute infarction or prior microhemorrhages. Skull base flow voids and basal cisterns are patent. Sagittal survey of midline structures is unremarkable. Following intravenous contrast administration, there is no abnormal parenchymal or leptomeningeal enhancement. There are no extra-axial fluid collections. There is extensive pansinusitis. Mastoid air cells and orbital contents are unremarkable.
IMPRESSION: 1. No evidence of demyelinating disease. 

 2. No abnormal parenchymal or leptomeningeal enhancement.

## 2021-03-30 IMAGING — MR MRI CERVICAL SPINE WITHOUT AND WITH CONTRAST
9 series · 48 of 48 positions shown · IV contrast (gadavist)
Comparison: MRI dated 01/14/2020.

﻿EXAM:  [id number]   MRI CERVICAL SPINE WITHOUT AND WITH CONTRAST
INDICATION: History of multiple sclerosis, follow-up exam.
TECHNIQUE: Multiplanar multisequential MRI of the cervical spine was performed without and with 5 mL of Gadavist.

[Series 4: s-map · sagittal · 8.8mm · 4.38mm/px · 19 of 100 slices shown]
[im 1/100]
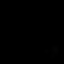
[im 6/100]
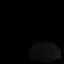
[im 12/100]
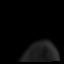
[im 17/100]
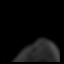
[im 23/100]
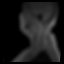
[im 28/100]
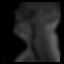
[im 34/100]
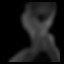
[im 39/100]
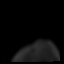
[im 45/100]
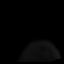
[im 50/100]
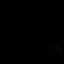
[im 56/100]
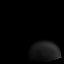
[im 61/100]
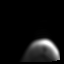
[im 67/100]
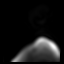
[im 72/100]
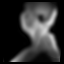
[im 78/100]
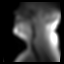
[im 83/100]
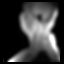
[im 89/100]
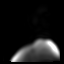
[im 94/100]
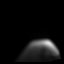
[im 100/100]
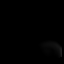

[Series 5: T2 · sagittal · 3.0mm · 0.75mm/px · 3 of 13 slices shown (1 of 2)]
[im 1/13]
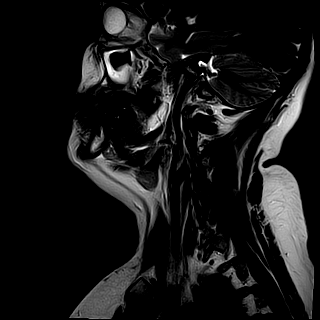
[im 7/13]
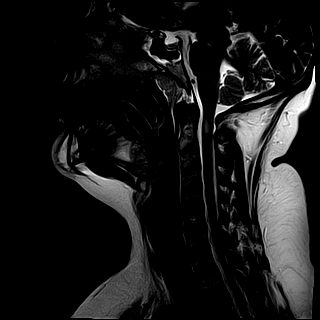
[im 13/13]
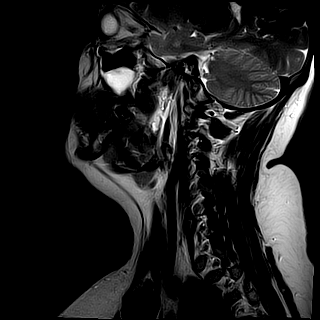

[Series 6: T1 · sagittal · 3.0mm · 0.47mm/px · 3 of 13 slices shown]
[im 1/13]
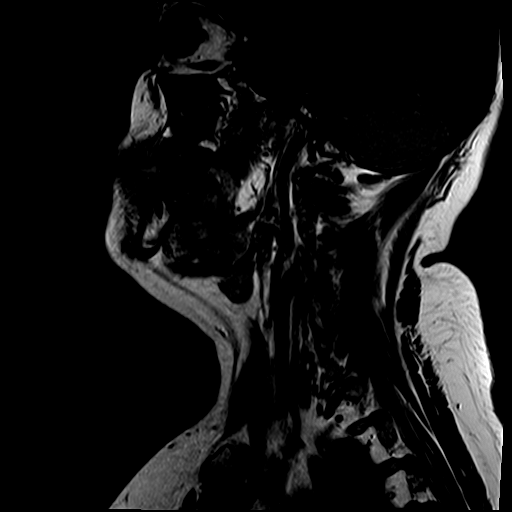
[im 7/13]
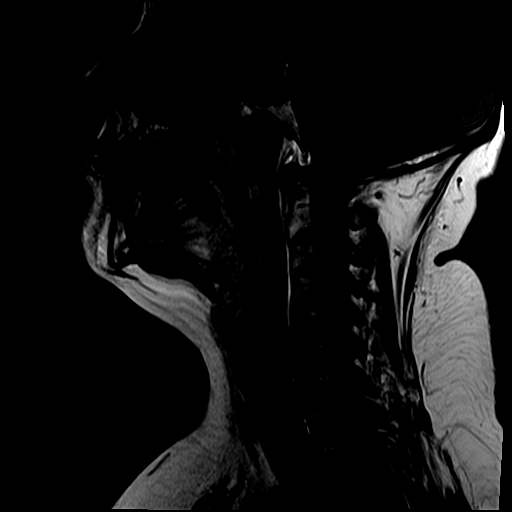
[im 13/13]
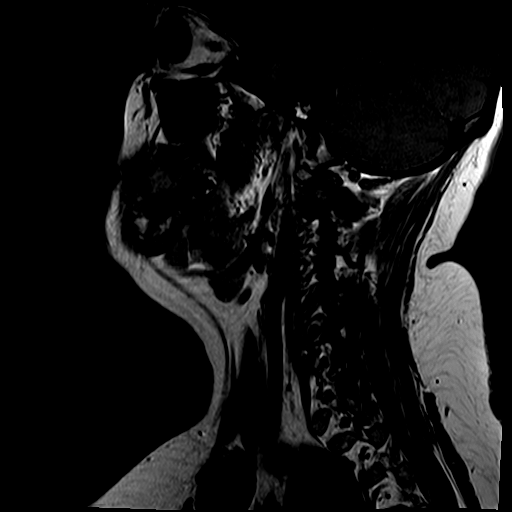

[Series 8: T1 fat-sat · sagittal · 3.0mm · 0.75mm/px · 3 of 13 slices shown (1 of 2)]
[im 1/13]
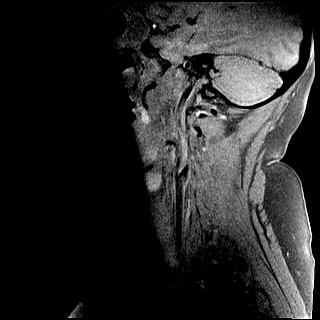
[im 7/13]
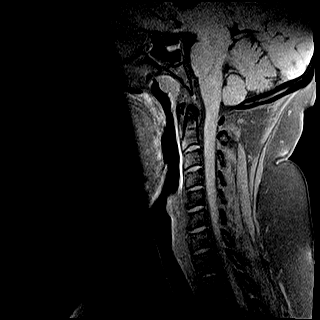
[im 13/13]
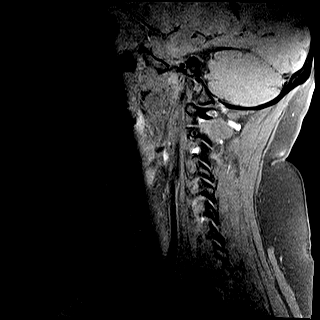

[Series 9: STIR · sagittal · 3.0mm · 0.47mm/px · 3 of 13 slices shown]
[im 1/13]
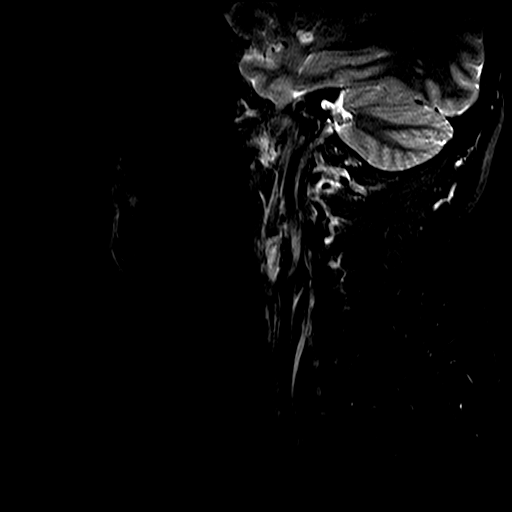
[im 7/13]
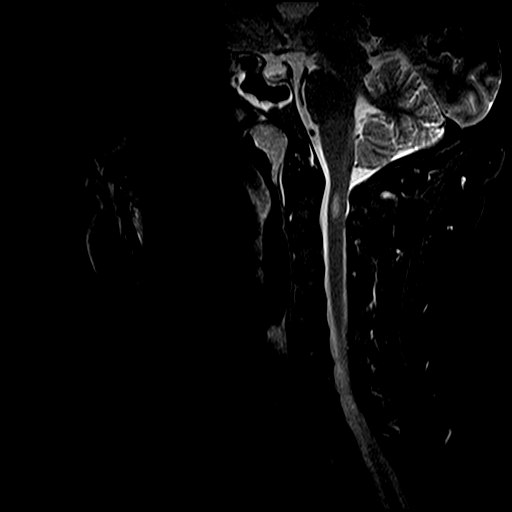
[im 13/13]
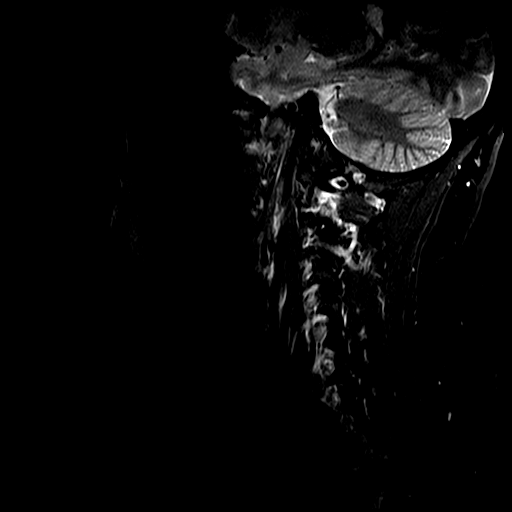

[Series 10: T2 · axial · 3.5mm · 0.35mm/px · z∈[-85,+26]mm · 5 of 26 slices shown (2 of 2)]
[im 1/26]
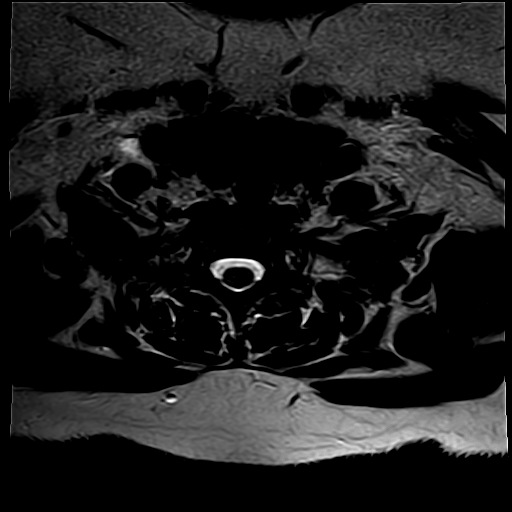
[im 7/26]
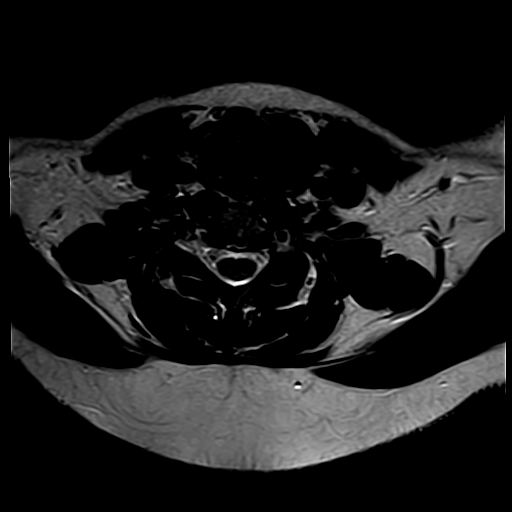
[im 13/26]
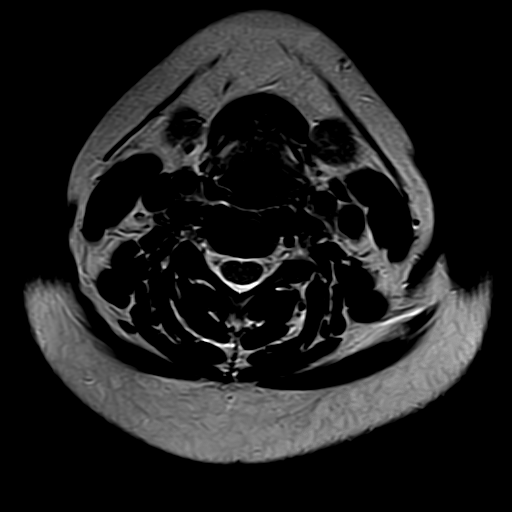
[im 19/26]
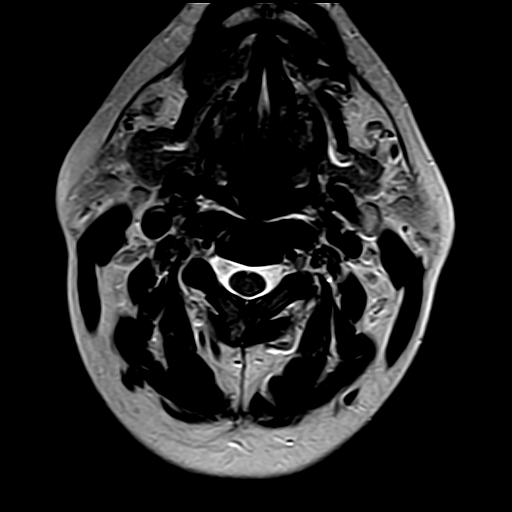
[im 26/26]
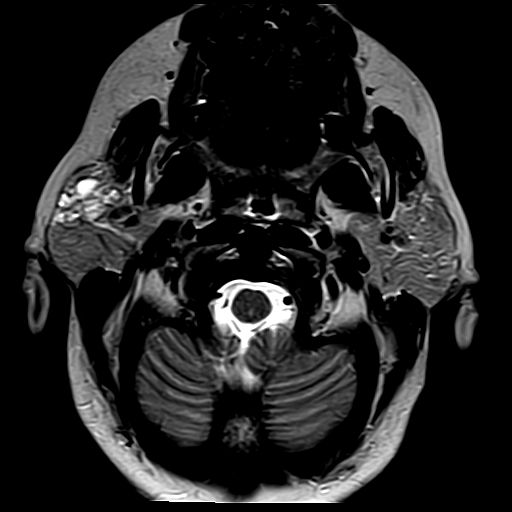

[Series 11: T1 fat-sat · axial · 3.5mm · 0.70mm/px · z∈[-85,+26]mm · 5 of 26 slices shown (2 of 2)]
[im 1/26]
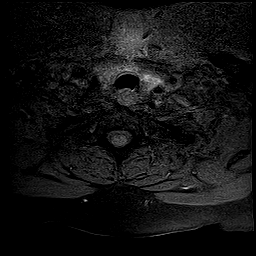
[im 7/26]
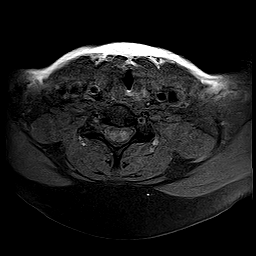
[im 13/26]
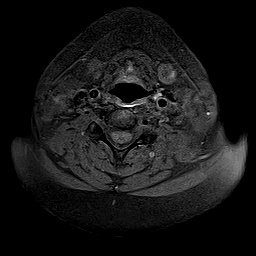
[im 19/26]
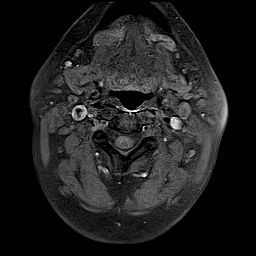
[im 26/26]
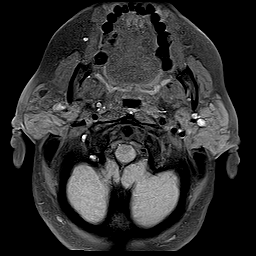

[Series 16: T1 fat-sat post-contrast · axial · 3.5mm · 0.70mm/px · z∈[-78,+34]mm · 5 of 26 slices shown (1 of 2)]
[im 1/26]
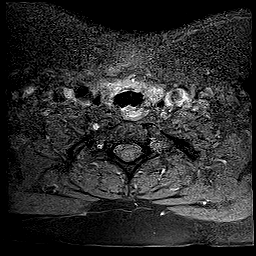
[im 7/26]
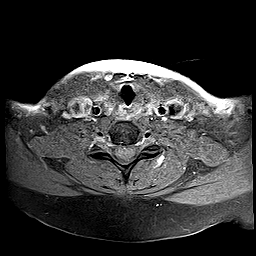
[im 13/26]
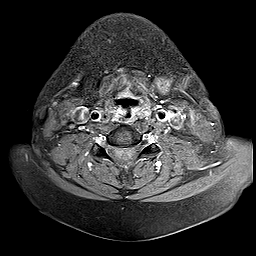
[im 19/26]
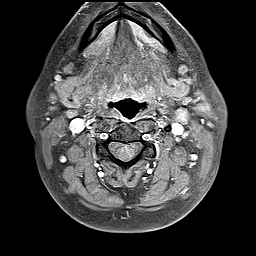
[im 26/26]
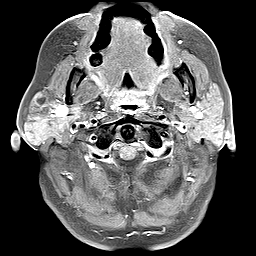

[Series 17: T1 fat-sat post-contrast · sagittal · 3.0mm · 0.75mm/px · 2 of 11 slices shown (2 of 2)]
[im 1/11]
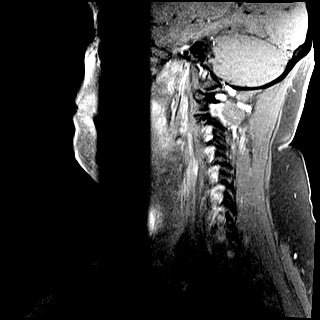
[im 11/11]
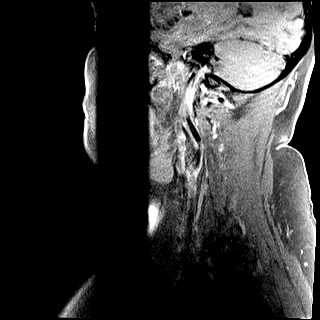

[48 of 48 positions shown; findings below may reference images not displayed]

FINDINGS: Vertebral bodies are normal in height, alignment and signal intensity. There is no acute fracture or subluxation. A 12.5 mm demyelinating plaque within the left aspect of the spinal cord at the level of C2 vertebral body is again identified. There are no new lesions.

No significant disc herniation, spinal canal or neural foraminal stenosis is seen at any level.

Following intravenous contrast administration, there is no abnormal spinal cord or paraspinal soft tissue enhancement.
IMPRESSION: Stable 12.5 mm demyelinating plaque within the spinal cord at the level of C2 vertebral body. No evidence of active demyelination. No new plaques.

## 2021-04-16 IMAGING — CR XRAY SINUSES COMPLETE
1 series · 3 of 3 positions shown · non-contrast
Comparison: None available.

﻿EXAM:  [id number]      XRAY SINUSES COMPLETE
INDICATION: Chronic sinusitis.

[Series 1: view not recorded · 0.17mm/px · 3 of 3 slices shown]
[im 1/3]
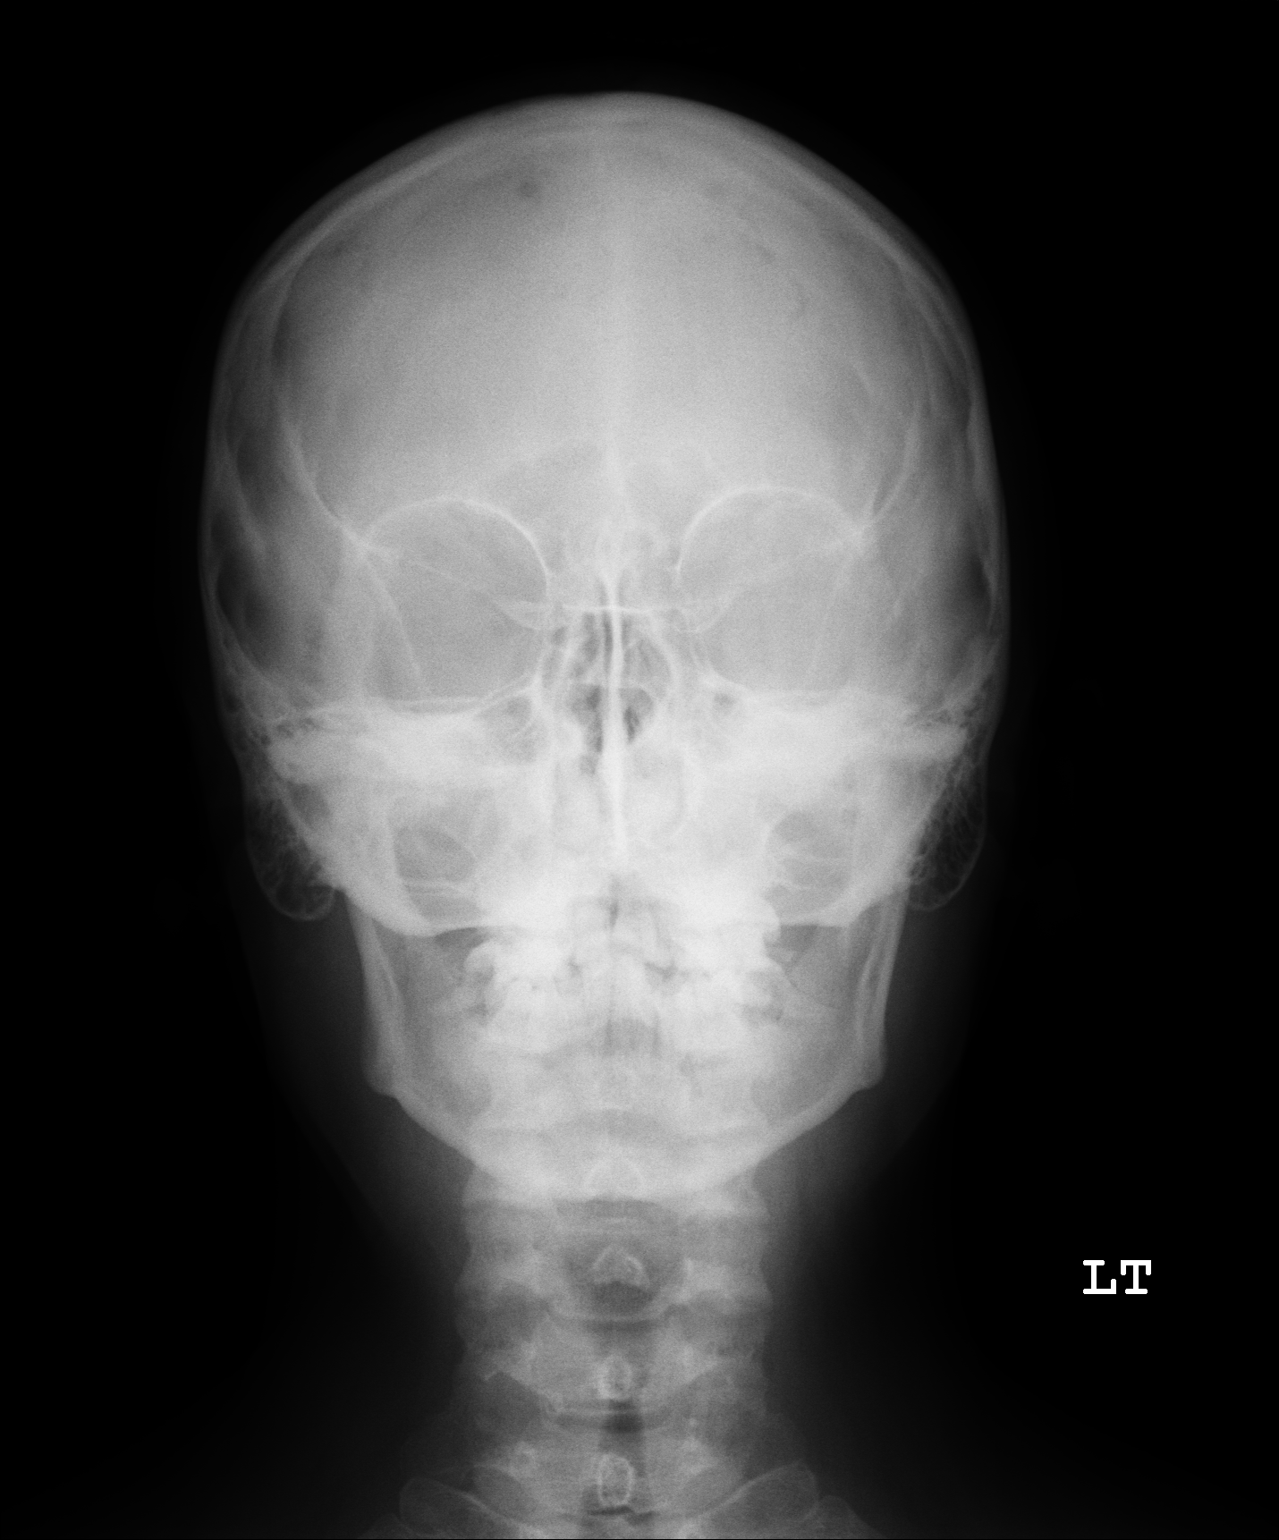
[im 2/3]
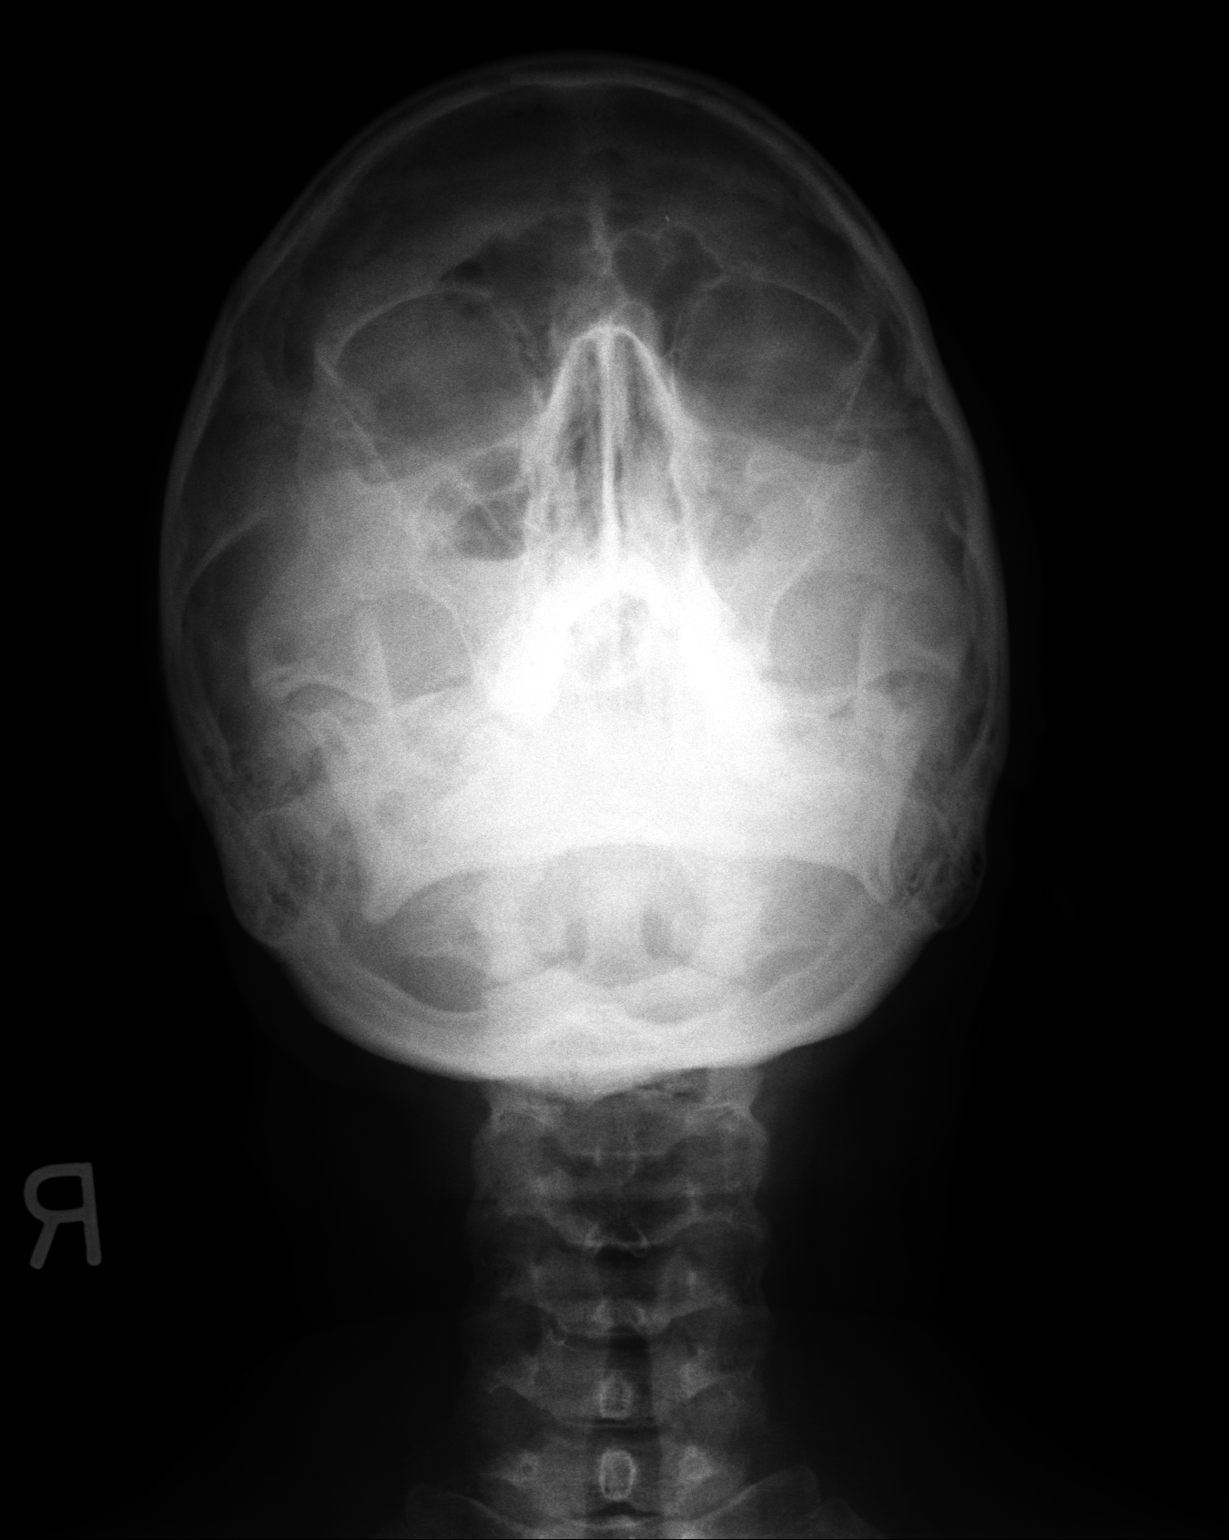
[im 3/3]
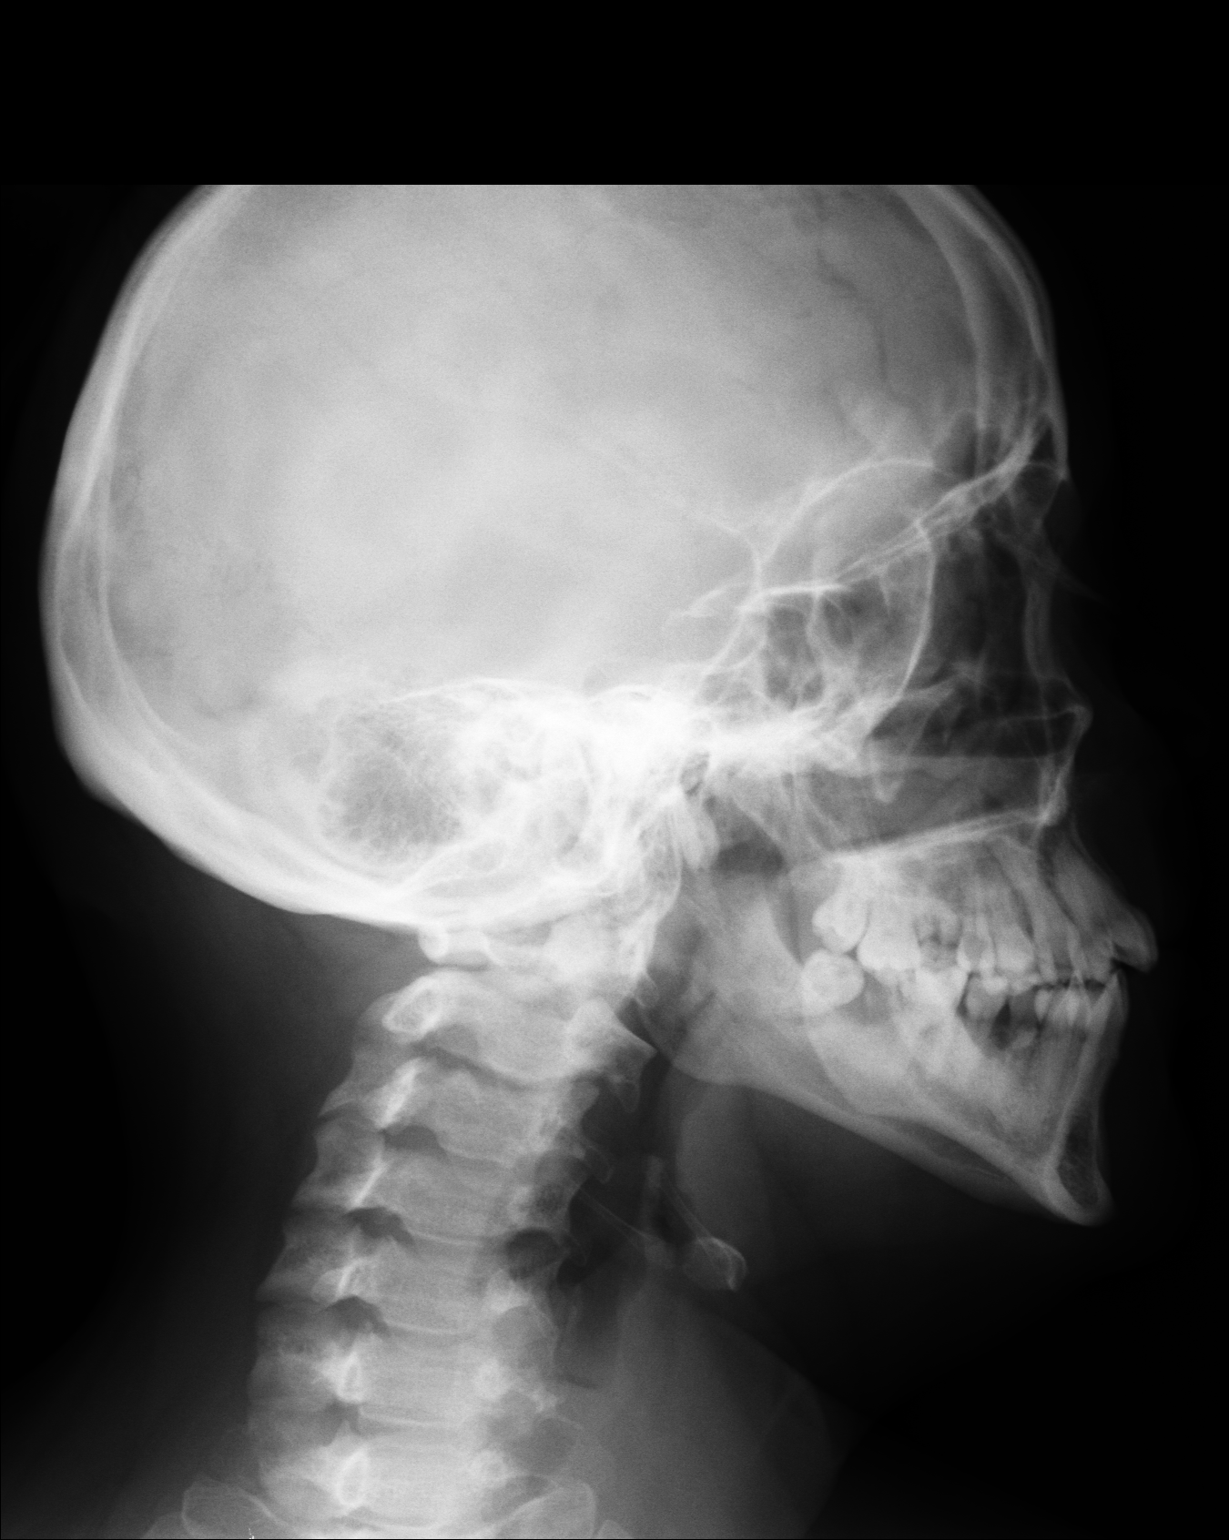

[3 of 3 positions shown; findings below may reference images not displayed]

FINDINGS: There is moderate left maxillary sinus mucoperiosteal thickening. Air-fluid level within the right maxillary sinus is also identified. Frontal sinuses appear well aerated. Nasal septum is midline.
IMPRESSION: Acute right maxillary sinusitis. Moderate left maxillary sinus mucoperiosteal thickening.

## 2021-05-15 IMAGING — CT CT MAXILLOFACIAL W/O CONTRAST
3 series · 16 of 47 positions shown, 19 images · non-contrast
Comparison: None available.

﻿EXAM:  CT MAXILLOFACIAL W/O CONTRAST
INDICATION: Chronic sinusitis.
TECHNIQUE: Axial noncontrast CT imaging of the paranasal sinuses was performed. Images were reviewed in multiple windows and projections. Exam was performed using 1 or more of the following dose reduction techniques: Automated exposure control, adjustment of the mA and/or kV according to patient size, or the use of iterative reconstruction technique.

[ax · axial · 0.28mm/px · z∈[-428,-350]mm · 10 of 47 slices shown, 13 images]
[im 4/47  brain]
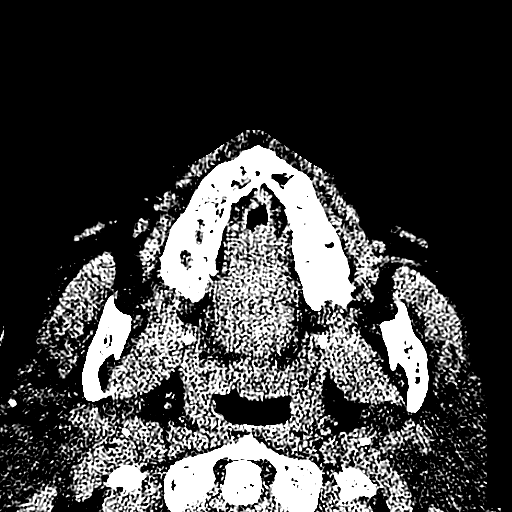
[im 4/47  bone]
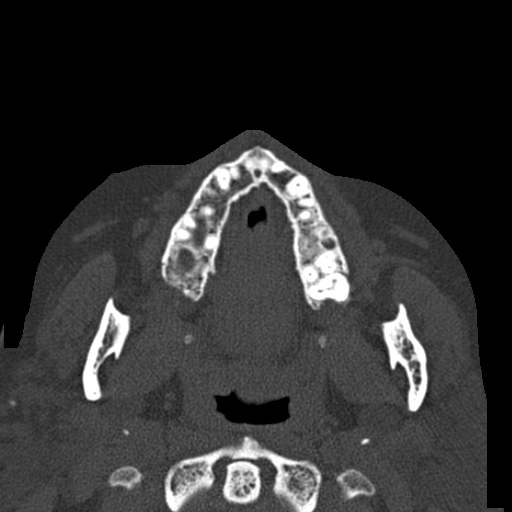
[im 8/47  bone]
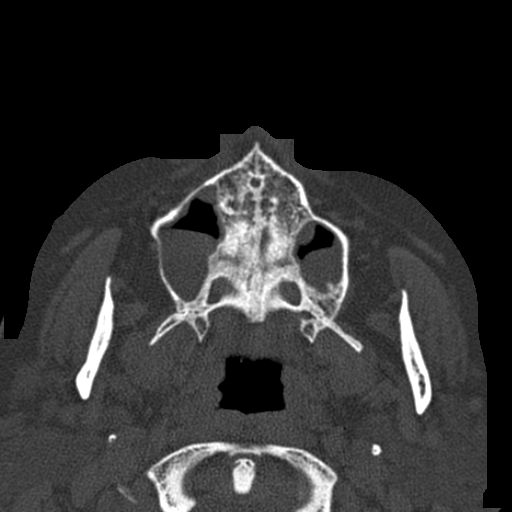
[im 13/47  bone]
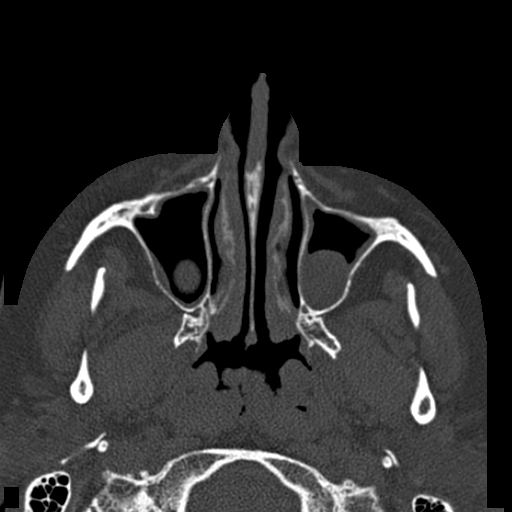
[im 16/47  bone]
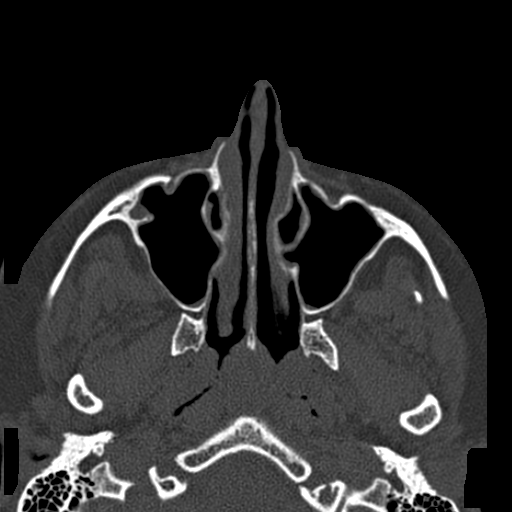
[im 21/47  brain]
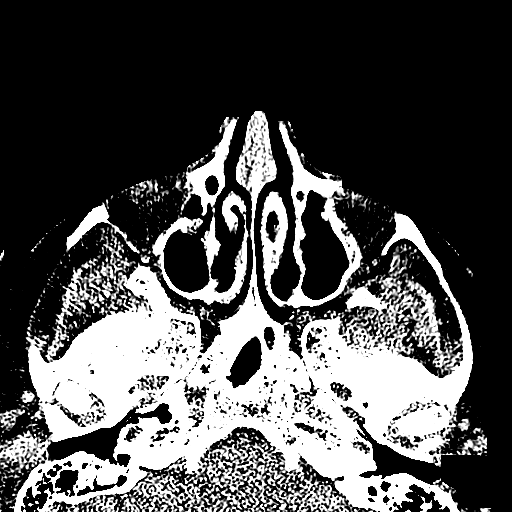
[im 21/47  bone]
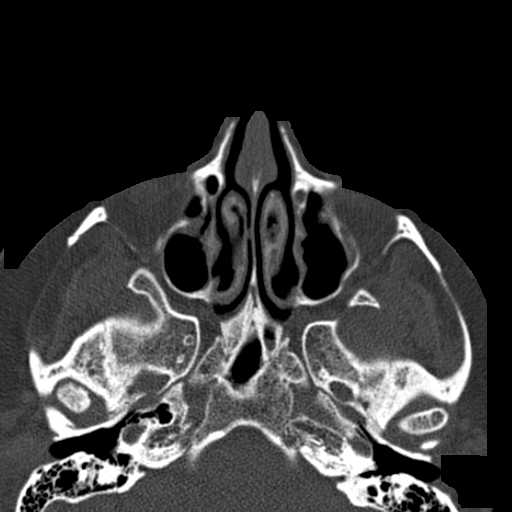
[im 26/47  bone]
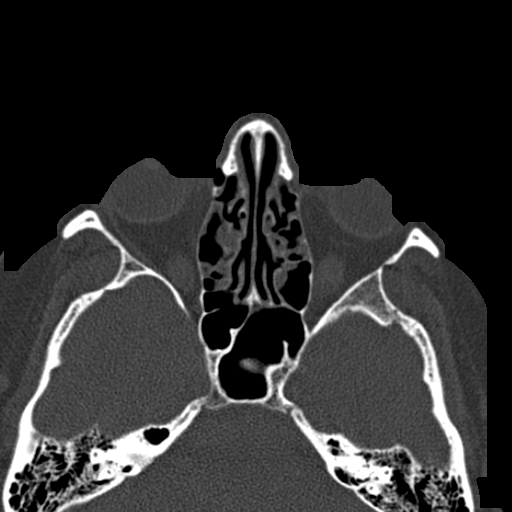
[im 31/47  bone]
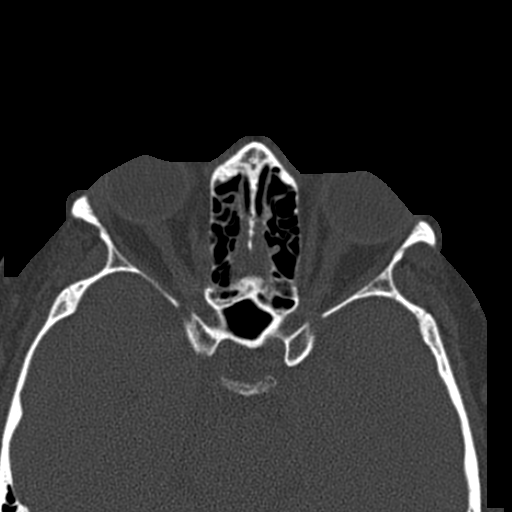
[im 35/47  bone]
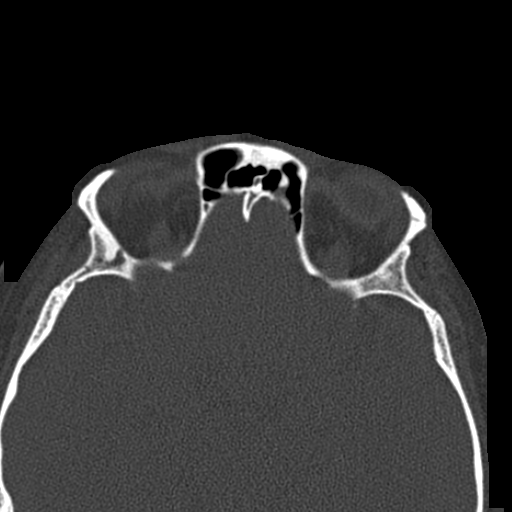
[im 39/47  brain]
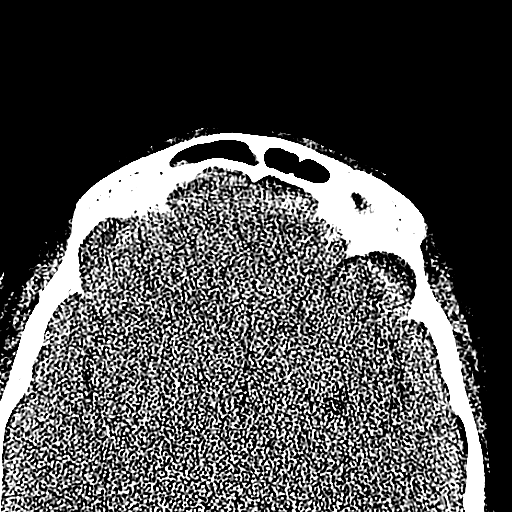
[im 39/47  bone]
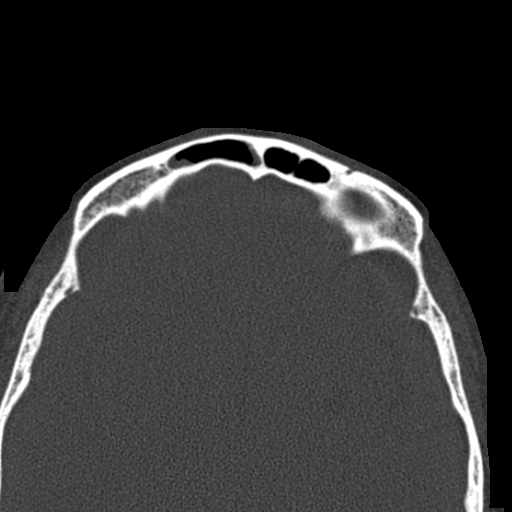
[im 43/47  bone]
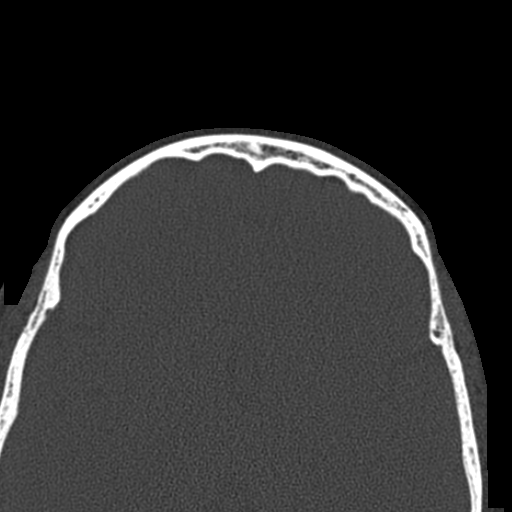

[cor · coronal · 0.23mm/px · 3 of 48 slices shown]
[im 16/48  bone]
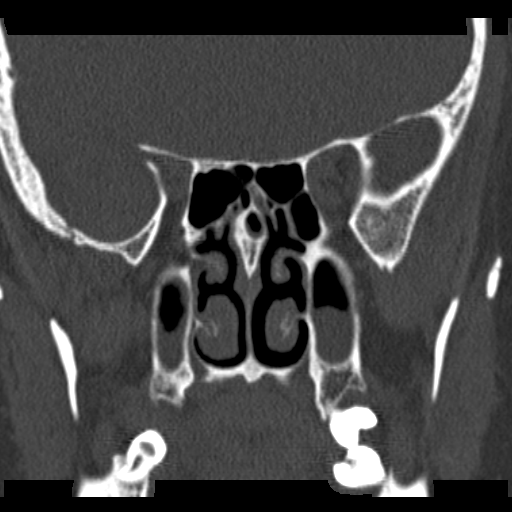
[im 21/48  bone]
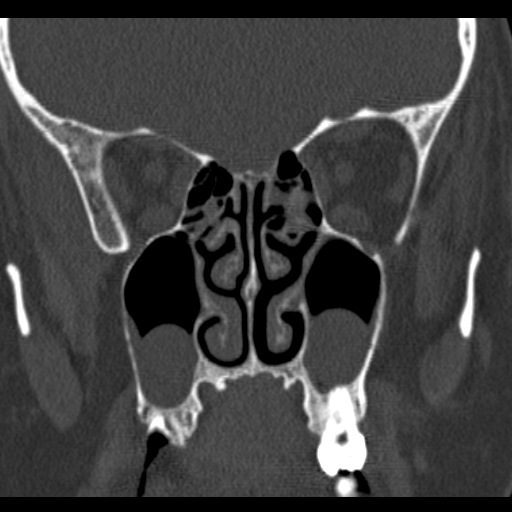
[im 27/48  bone]
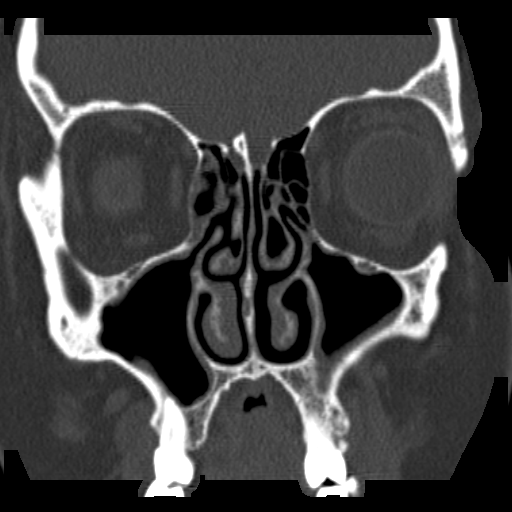

[sag · sagittal · 0.22mm/px · 3 of 60 slices shown]
[im 20/60  bone]
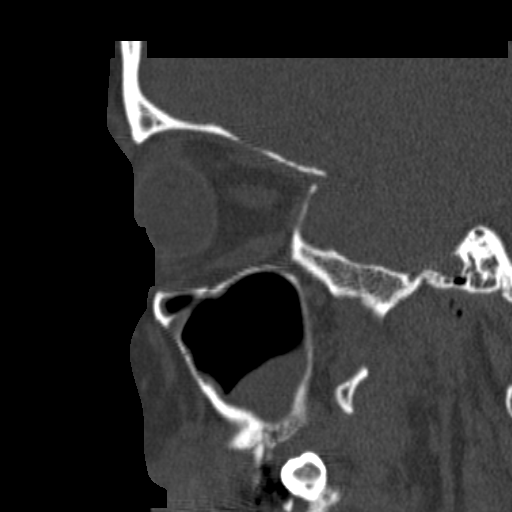
[im 30/60  bone]
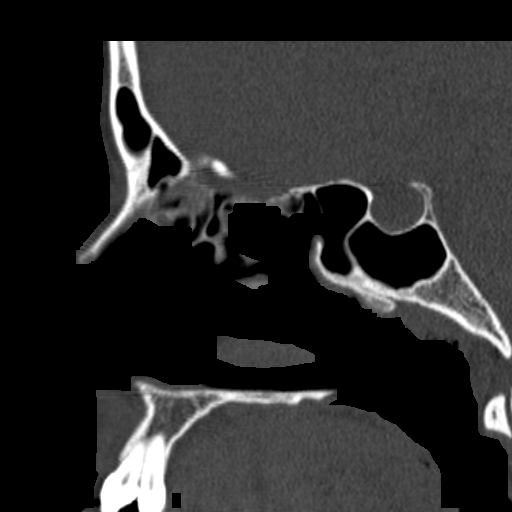
[im 40/60  bone]
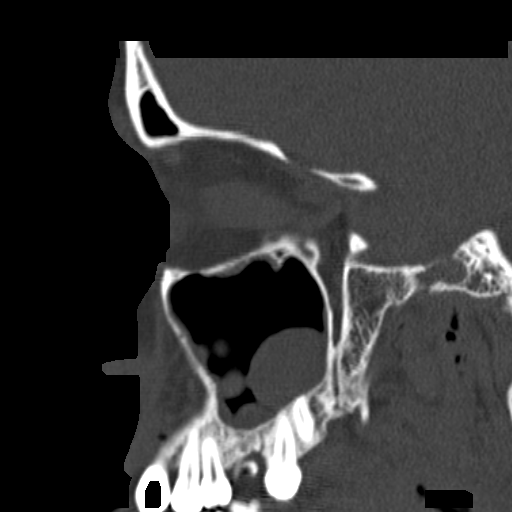

[16 of 47 positions shown; findings below may reference images not displayed]

FINDINGS: There is mild mucoperiosteal thickening within the maxillary and ethmoid sinuses bilaterally. Mucous retention cysts versus polyps are noted within the dependent maxillary sinuses bilaterally, measuring up to 18 mm on the right side and 17 mm on the left side. Ostiomeatal units are patent bilaterally. There is no significant congestion of the nasal turbinates. Bilateral concha bullosa are incidentally noted. Nasal septum is midline.
IMPRESSION: Chronic sinus disease as detailed above.

## 2021-11-17 ENCOUNTER — Other Ambulatory Visit: Payer: Self-pay

## 2021-11-17 ENCOUNTER — Ambulatory Visit (INDEPENDENT_AMBULATORY_CARE_PROVIDER_SITE_OTHER): Payer: Medicaid Other | Admitting: OTOLARYNGOLOGY

## 2021-11-17 ENCOUNTER — Encounter (INDEPENDENT_AMBULATORY_CARE_PROVIDER_SITE_OTHER): Payer: Self-pay | Admitting: OTOLARYNGOLOGY

## 2021-11-17 VITALS — Ht 65.0 in | Wt 200.0 lb

## 2021-11-17 DIAGNOSIS — Z6833 Body mass index (BMI) 33.0-33.9, adult: Secondary | ICD-10-CM

## 2021-11-17 DIAGNOSIS — Z9889 Other specified postprocedural states: Secondary | ICD-10-CM

## 2021-11-17 DIAGNOSIS — J309 Allergic rhinitis, unspecified: Secondary | ICD-10-CM

## 2021-11-17 NOTE — Procedures (Signed)
[facility]  [address]  [place] [address]    Procedure Note    Name: Jamie Savage MRN:  [id number]   Date: 11/17/2021 Age: 41 y.o.       [address]31 - NASAL ENDOSCOPY DIAGNOSTIC UNILATERAL OR BILATERAL (AMB ONLY)    Date/Time: 11/17/2021 9:04 AM  Performed by: Conchita Paris, DO  Authorized by: Conchita Paris, DO     Time Out:     Immediately before the procedure, a time out was called:  Yes    Patient verified:  Yes    Procedure Verified:  Yes    Site Verified:  Yes  Documentation:      Indications for procedure: Monitor chronic disease    Anesthesia: Oxymetazoline nasal spray    Description: Nasal endoscopy with rigid scope was performed with examination of the  septum, inferior, middle, and superior meatus, turbinates, sphenoethmoidal recess, and nasopharynx.     There were no polyps, pus, or granulation tissue noted.  ET orifices and nasopharynx were normal.     Findings: OMC's widely patent and no mucopus, septum midline    The patient tolerated the procedure well.              Conchita Paris, DO

## 2021-11-17 NOTE — H&P (Signed)
[facility]  [address]  [place] [address]    Progress Note    Name: Jamie Savage MRN:  [id number]   Date: 11/17/2021 Age: 41 y.o.          Follow Up      Subjective:   Chief Complaint:   Sinus Problem (1 month rc on sinuses. States no complaints noted)       History of Present Illness:  Jamie Savage is a 41 y.o. old female who presents to the clinic for follow-up after FESS.  Doing well, breathing has improved significantly.  Denies any epistaxis or PND.  Patient is happy with results.     Review of Systems     Physical Exam:     Vitals:    11/17/21 0842   Weight: 90.7 kg (200 lb)   Height: 1.651 m (5\' 5" )   BMI: 33.35      ENT Physical Exam  Constitutional  Appearance: patient appears well-developed, well-nourished and well-groomed,  Communication/Voice: communication appropriate for developmental age; vocal quality normal;  Head and Face  Appearance: head appears normal, face appears normal and face appears atraumatic;  Palpation: facial palpation normal;  Salivary: glands normal;  Ear  Hearing: intact;  Auricles: right auricle normal; left auricle normal;  External Mastoids: right external mastoid normal; left external mastoid normal;  Ear Canals: right ear canal normal; left ear canal normal;  Tympanic Membranes: right tympanic membrane normal; left tympanic membrane normal;  Nose  External Nose: nares patent bilaterally; external nose normal;  Internal Nose: nasal mucosa normal; septum normal; bilateral inferior turbinates normal;  Oral Cavity/Oropharynx  Lips: normal;  Teeth: normal;  Gums: gingiva normal;  Tongue: normal;  Oral mucosa: normal;  Hard palate: normal;  Neck  Neck: neck normal; neck palpation normal;  Thyroid: thyroid normal;  Respiratory  Inspection: breathing unlabored; normal breathing rate;  Lymphatic  Palpation: lymph nodes normal;  Neurovestibular  Mental Status: alert and oriented;  Psychiatric: mood normal; affect is appropriate;  Cranial Nerves: cranial  nerves intact;       Assessment and Plan:       ICD-10-CM    1. S/P FESS (functional endoscopic sinus surgery)  Z98.890       2. Allergic rhinitis  J30.9 [id number] - NASAL ENDOSCOPY DIAGNOSTIC UNILATERAL OR BILATERAL (AMB ONLY)        Orders Placed This Encounter   . - NASAL ENDOSCOPY DIAGNOSTIC UNILATERAL OR BILATERAL (AMB ONLY)     -Doing well, recommend starting Flonase and continuing saline irrigations.      Follow up:  Return in about 3 months (around 02/17/2022).    02/19/2022, DO

## 2021-12-21 ENCOUNTER — Ambulatory Visit: Payer: Medicaid Other

## 2021-12-21 ENCOUNTER — Other Ambulatory Visit: Payer: Self-pay

## 2021-12-21 DIAGNOSIS — E559 Vitamin D deficiency, unspecified: Secondary | ICD-10-CM | POA: Insufficient documentation

## 2021-12-21 DIAGNOSIS — G609 Hereditary and idiopathic neuropathy, unspecified: Secondary | ICD-10-CM | POA: Insufficient documentation

## 2021-12-21 DIAGNOSIS — E785 Hyperlipidemia, unspecified: Secondary | ICD-10-CM | POA: Insufficient documentation

## 2021-12-21 DIAGNOSIS — G35 Multiple sclerosis: Secondary | ICD-10-CM | POA: Insufficient documentation

## 2021-12-21 DIAGNOSIS — R739 Hyperglycemia, unspecified: Secondary | ICD-10-CM | POA: Insufficient documentation

## 2021-12-21 LAB — COMPREHENSIVE METABOLIC PANEL, NON-FASTING
ALBUMIN/GLOBULIN RATIO: 1.3 (ref 0.8–1.4)
ALBUMIN: 3.9 g/dL (ref 3.5–5.7)
ALKALINE PHOSPHATASE: 51 U/L (ref 34–104)
ALT (SGPT): 25 U/L (ref 7–52)
ANION GAP: 5 mmol/L — ABNORMAL LOW (ref 10–20)
AST (SGOT): 21 U/L (ref 13–39)
BILIRUBIN TOTAL: 0.3 mg/dL (ref 0.3–1.2)
BUN/CREA RATIO: 13 (ref 6–22)
BUN: 14 mg/dL (ref 7–25)
CALCIUM, CORRECTED: 9.1 mg/dL (ref 8.9–10.8)
CALCIUM: 9 mg/dL (ref 8.6–10.3)
CHLORIDE: 108 mmol/L — ABNORMAL HIGH (ref 98–107)
CO2 TOTAL: 24 mmol/L (ref 21–31)
CREATININE: 1.12 mg/dL (ref 0.60–1.30)
ESTIMATED GFR: 64 mL/min/{1.73_m2} (ref >59)
GLOBULIN: 2.9 (ref 2.9–5.4)
GLUCOSE: 92 mg/dL (ref 74–109)
OSMOLALITY, CALCULATED: 274 mOsm/kg (ref 270–290)
POTASSIUM: 4.5 mmol/L (ref 3.5–5.1)
PROTEIN TOTAL: 6.8 g/dL (ref 6.4–8.9)
SODIUM: 137 mmol/L (ref 136–145)

## 2021-12-21 LAB — CBC WITH DIFF
BASOPHIL #: 0.1 10*3/uL (ref 0.00–0.30)
BASOPHIL %: 1 % (ref 0–3)
EOSINOPHIL #: 0.5 10*3/uL (ref 0.00–0.80)
EOSINOPHIL %: 5 % (ref 0–7)
HCT: 40.8 % (ref 37.0–47.0)
HGB: 13.6 g/dL (ref 12.5–16.0)
LYMPHOCYTE #: 2.7 10*3/uL (ref 1.10–5.00)
LYMPHOCYTE %: 29 % (ref 25–45)
MCH: 28.4 pg (ref 27.0–32.0)
MCHC: 33.3 g/dL (ref 32.0–36.0)
MCV: 85.3 fL (ref 78.0–99.0)
MONOCYTE #: 0.5 10*3/uL (ref 0.00–1.30)
MONOCYTE %: 5 % (ref 0–12)
MPV: 9.3 fL (ref 7.4–10.4)
NEUTROPHIL #: 5.4 10*3/uL (ref 1.80–8.40)
NEUTROPHIL %: 59 % (ref 40–76)
PLATELETS: 379 10*3/uL (ref 140–440)
RBC: 4.78 10*6/uL (ref 4.20–5.40)
RDW: 14 % (ref 11.6–14.8)
WBC: 9.1 10*3/uL (ref 4.0–10.5)
WBCS UNCORRECTED: 9.1 10*3/uL

## 2021-12-21 LAB — LIPID PANEL
CHOL/HDL RATIO: 3.4
CHOLESTEROL: 171 mg/dL (ref <200)
HDL CHOL: 50 mg/dL (ref 23–92)
LDL CALC: 107 mg/dL — ABNORMAL HIGH (ref 0–100)
TRIGLYCERIDES: 70 mg/dL (ref <=150)
VLDL CALC: 14 mg/dL (ref 0–50)

## 2021-12-21 LAB — VITAMIN D 25 TOTAL: VITAMIN D: 56 ng/mL (ref 30–100)

## 2021-12-21 LAB — HGA1C (HEMOGLOBIN A1C WITH EST AVG GLUCOSE): HEMOGLOBIN A1C: 5.4 % (ref 4.0–6.0)

## 2021-12-21 LAB — FOLATE: FOLATE: 8.3 ng/mL (ref 5.9–24.4)

## 2021-12-21 LAB — THYROID STIMULATING HORMONE (SENSITIVE TSH): TSH: 2.454 u[IU]/mL (ref 0.450–5.330)

## 2021-12-21 LAB — VITAMIN B12: VITAMIN B 12: 295 pg/mL (ref 180–914)

## 2022-02-16 ENCOUNTER — Encounter (INDEPENDENT_AMBULATORY_CARE_PROVIDER_SITE_OTHER): Payer: Self-pay | Admitting: OTOLARYNGOLOGY

## 2023-01-19 ENCOUNTER — Ambulatory Visit (RURAL_HEALTH_CENTER): Payer: BC Managed Care – PPO | Attending: INTERNAL MEDICINE

## 2023-01-19 ENCOUNTER — Other Ambulatory Visit: Payer: Self-pay

## 2023-01-19 DIAGNOSIS — R5383 Other fatigue: Secondary | ICD-10-CM | POA: Insufficient documentation

## 2023-01-19 DIAGNOSIS — E785 Hyperlipidemia, unspecified: Secondary | ICD-10-CM | POA: Insufficient documentation

## 2023-01-19 DIAGNOSIS — E559 Vitamin D deficiency, unspecified: Secondary | ICD-10-CM | POA: Insufficient documentation

## 2023-01-19 DIAGNOSIS — R400 Somnolence: Secondary | ICD-10-CM | POA: Insufficient documentation

## 2023-01-19 DIAGNOSIS — G35 Multiple sclerosis: Secondary | ICD-10-CM | POA: Insufficient documentation

## 2023-01-19 LAB — COMPREHENSIVE METABOLIC PANEL, NON-FASTING
ALBUMIN/GLOBULIN RATIO: 1.5 — ABNORMAL HIGH (ref 0.8–1.4)
ALBUMIN: 4.1 g/dL (ref 3.5–5.7)
ALKALINE PHOSPHATASE: 54 U/L (ref 34–104)
ALT (SGPT): 11 U/L (ref 7–52)
ANION GAP: 6 mmol/L (ref 4–13)
AST (SGOT): 14 U/L (ref 13–39)
BILIRUBIN TOTAL: 0.3 mg/dL (ref 0.3–1.2)
BUN/CREA RATIO: 18 (ref 6–22)
BUN: 17 mg/dL (ref 7–25)
CALCIUM, CORRECTED: 9.2 mg/dL (ref 8.9–10.8)
CALCIUM: 9.3 mg/dL (ref 8.6–10.3)
CHLORIDE: 105 mmol/L (ref 98–107)
CO2 TOTAL: 28 mmol/L (ref 21–31)
CREATININE: 0.96 mg/dL (ref 0.60–1.30)
ESTIMATED GFR: 76 mL/min/{1.73_m2} (ref >59)
GLOBULIN: 2.8 — ABNORMAL LOW (ref 2.9–5.4)
GLUCOSE: 89 mg/dL (ref 74–109)
OSMOLALITY, CALCULATED: 279 mOsm/kg (ref 270–290)
POTASSIUM: 4.2 mmol/L (ref 3.5–5.1)
PROTEIN TOTAL: 6.9 g/dL (ref 6.4–8.9)
SODIUM: 139 mmol/L (ref 136–145)

## 2023-01-19 LAB — CBC
HCT: 40.1 % (ref 31.2–41.9)
HGB: 13.5 g/dL (ref 10.9–14.3)
MCH: 29.1 pg (ref 24.7–32.8)
MCHC: 33.6 g/dL (ref 32.3–35.6)
MCV: 86.4 fL (ref 75.5–95.3)
MPV: 9.5 fL (ref 7.9–10.8)
PLATELETS: 385 10*3/uL (ref 140–440)
RBC: 4.64 10*6/uL (ref 3.63–4.92)
RDW: 14.4 % (ref 12.3–17.7)
WBC: 9.6 10*3/uL (ref 3.8–11.8)

## 2023-01-19 LAB — LIPID PANEL
CHOL/HDL RATIO: 2.5
CHOLESTEROL: 133 mg/dL (ref <200)
HDL CHOL: 54 mg/dL (ref >=40)
LDL CALC: 55 mg/dL (ref 0–100)
TRIGLYCERIDES: 122 mg/dL (ref <=150)
VLDL CALC: 24 mg/dL (ref 0–50)

## 2023-01-19 LAB — THYROID STIMULATING HORMONE WITH FREE T4 REFLEX: TSH: 1.556 u[IU]/mL (ref 0.450–5.330)

## 2023-01-19 LAB — VITAMIN D 25 TOTAL: VITAMIN D: 48 ng/mL (ref 30–100)

## 2023-01-19 LAB — VITAMIN B12: VITAMIN B 12: 368 pg/mL (ref 180–914)

## 2023-01-20 LAB — HGA1C (HEMOGLOBIN A1C WITH EST AVG GLUCOSE): HEMOGLOBIN A1C: 5.7 % (ref 4.0–6.0)

## 2023-02-01 ENCOUNTER — Ambulatory Visit (RURAL_HEALTH_CENTER): Payer: BC Managed Care – PPO | Attending: INTERNAL MEDICINE | Admitting: INTERNAL MEDICINE

## 2023-02-01 ENCOUNTER — Encounter (RURAL_HEALTH_CENTER): Payer: Self-pay | Admitting: INTERNAL MEDICINE

## 2023-02-01 ENCOUNTER — Other Ambulatory Visit: Payer: Self-pay

## 2023-02-01 VITALS — BP 118/78 | HR 75 | Resp 18 | Ht 65.0 in | Wt 227.2 lb

## 2023-02-01 DIAGNOSIS — Z0000 Encounter for general adult medical examination without abnormal findings: Secondary | ICD-10-CM | POA: Insufficient documentation

## 2023-02-01 DIAGNOSIS — J329 Chronic sinusitis, unspecified: Secondary | ICD-10-CM | POA: Insufficient documentation

## 2023-02-01 DIAGNOSIS — G35 Multiple sclerosis: Secondary | ICD-10-CM | POA: Insufficient documentation

## 2023-02-01 DIAGNOSIS — F321 Major depressive disorder, single episode, moderate: Secondary | ICD-10-CM | POA: Insufficient documentation

## 2023-02-01 DIAGNOSIS — G43909 Migraine, unspecified, not intractable, without status migrainosus: Secondary | ICD-10-CM | POA: Insufficient documentation

## 2023-02-01 MED ORDER — FLUTICASONE PROPIONATE 50 MCG/ACTUATION NASAL SPRAY,SUSPENSION
1.0000 | Freq: Every day | NASAL | 3 refills | Status: AC
Start: 2023-02-01 — End: ?

## 2023-02-01 NOTE — Nursing Note (Signed)
Patient is here for her follow up. Patient has no new problems.

## 2023-02-01 NOTE — Progress Notes (Signed)
[facility]  [address]  [place], [place]  [address]  Phone: [phone] Fax: [phone]    Name: Jamie Savage                       Date of Birth: 1981-07-24   MRN:  [id number]                         Date of visit: 02/01/2023     Chief Complaint: Follow Up 3 Months (1 yr follow up)    Current Outpatient Medications   Medication Sig    AJOVY AUTOINJECTOR 225 mg/1.5 mL Subcutaneous Auto-Injector INJECT1.5ML ONCE EVERY MONTH    cholecalciferol, vitamin D3, 1,250 mcg (50,000 unit) Oral Capsule Take 1 Capsule (50,000 Units total) by mouth Every 7 days    COPAXONE 40 mg/mL Subcutaneous Syringe INJECT 1 ML THREE TIMES A WEEK SUB-Q    cyanocobalamin (VITAMIN B 12) 1,000 mcg Oral Tablet Take 1 Tablet (1,000 mcg total) by mouth Once a day    escitalopram oxalate (LEXAPRO) 20 mg Oral Tablet Take 1 Tablet (20 mg total) by mouth Once a day    fluticasone propionate (FLONASE) 50 mcg/actuation Nasal Spray, Suspension Administer 1 Spray into each nostril Once a day    methylphenidate HCl (RITALIN) 10 mg Oral Tablet Take 1 Tablet (10 mg total) by mouth Twice daily       Patient Active Problem List    Diagnosis Date Noted    Chronic sinusitis 02/01/2023    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 Last visit was 08-12-21, which was mostly preop.  Needs physical.     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   + fatigue      Objective :  BP 118/78 (Site: Left, Patient Position: Sitting, Cuff Size: Adult)   Pulse 75   Resp 18   Ht 1.651 m (5\' 5" )   Wt 103 kg (227 lb 4 oz)   SpO2 96%   BMI 37.82 kg/m       General:  appears in good health, comfortable  Lungs:  Clear to auscultation bilaterally.   Cardiovascular:  regular rate and rhythm, S1, S2 normal, no murmur, click, rub or gallop  Neurologic:  Grossly normal. Alert and oriented x3  Psychiatric:  Normal    Data reviewed:  ENT note 11-2021  Labs from Dr Aretha Parrot    Assessment/Plan:  Assessment/Plan   1. Chronic sinusitis    2. Moderate major depression (CMS HCC)    3. Multiple sclerosis (CMS HCC)    4. Migraine    5. Routine medical exam      Chronic  Sinusitis  Ok for flonase    Depression  On Lexapro    Multiple sclerosis  Follow with neuro    Migraine  Follow with neuro    Screening exam  Basic labs were done by Dr Aretha Parrot. Reviewed.     Review of labs were part of decision making.     Terald Sleeper, DO, [facility]  Internal Medicine

## 2023-03-02 ENCOUNTER — Ambulatory Visit (RURAL_HEALTH_CENTER): Payer: Self-pay | Admitting: Family

## 2023-03-06 ENCOUNTER — Other Ambulatory Visit: Payer: Self-pay

## 2023-03-15 ENCOUNTER — Other Ambulatory Visit: Payer: Self-pay

## 2023-03-15 ENCOUNTER — Ambulatory Visit: Payer: BC Managed Care – PPO | Attending: Family | Admitting: Family

## 2023-03-15 ENCOUNTER — Ambulatory Visit (RURAL_HEALTH_CENTER): Payer: BC Managed Care – PPO | Attending: Family | Admitting: Family

## 2023-03-15 ENCOUNTER — Encounter (RURAL_HEALTH_CENTER): Payer: Self-pay | Admitting: Family

## 2023-03-15 VITALS — BP 129/87 | HR 85 | Resp 16 | Ht 65.0 in | Wt 225.5 lb

## 2023-03-15 DIAGNOSIS — Z01419 Encounter for gynecological examination (general) (routine) without abnormal findings: Secondary | ICD-10-CM

## 2023-03-15 DIAGNOSIS — Z124 Encounter for screening for malignant neoplasm of cervix: Secondary | ICD-10-CM

## 2023-03-15 DIAGNOSIS — Z1159 Encounter for screening for other viral diseases: Secondary | ICD-10-CM

## 2023-03-15 DIAGNOSIS — Z114 Encounter for screening for human immunodeficiency virus [HIV]: Secondary | ICD-10-CM

## 2023-03-15 DIAGNOSIS — R102 Pelvic and perineal pain: Secondary | ICD-10-CM

## 2023-03-15 DIAGNOSIS — Z1231 Encounter for screening mammogram for malignant neoplasm of breast: Secondary | ICD-10-CM

## 2023-03-15 DIAGNOSIS — D229 Melanocytic nevi, unspecified: Secondary | ICD-10-CM | POA: Insufficient documentation

## 2023-03-15 NOTE — Progress Notes (Signed)
[facility]  [address]  [place], [place]  [address]  Phone: [phone] Fax: [phone]    Name: Jamie Savage                       Date of Birth: 1981-01-30   MRN:  [id number]                         Date of visit: 03/15/2023     Problem List Items Addressed This Visit    None  Visit Diagnoses       Well woman exam with routine gynecological exam    -  Primary    Cervical cancer screening        Relevant Orders    THIN PREP PAP AND HPV MRNA E6/E7 W/ REFLEX HPV 16,18/45 ([facility])    Encounter for screening mammogram for malignant neoplasm of breast        Relevant Orders    MAMMO BILATERAL SCREENING-ADDL VIEWS/BREAST US AS REQ BY RAD    Screening for HIV without presence of risk factors        Relevant Orders    HIV 1 AND 2 RAPID SCREEN    Encounter for hepatitis C screening test for low risk patient        Relevant Orders    HEPATITIS C ANTIBODY SCREEN WITH REFLEX TO HCV PCR    Atypical mole        Relevant Orders    Referral to External Provider            Chief Complaint: Annual Exam (Annual pap)    Past Medical History  Current Outpatient Medications   Medication Sig    AJOVY AUTOINJECTOR 225 mg/1.5 mL Subcutaneous Auto-Injector INJECT1.5ML ONCE EVERY MONTH    cholecalciferol, vitamin D3, 1,250 mcg (50,000 unit) Oral Capsule Take 1 Capsule (50,000 Units total) by mouth Every 7 days    COPAXONE 40 mg/mL Subcutaneous Syringe INJECT 1 ML THREE TIMES A WEEK SUB-Q    cyanocobalamin (VITAMIN B 12) 1,000 mcg Oral Tablet Take 1 Tablet (1,000 mcg total) by mouth Once a day    escitalopram oxalate (LEXAPRO) 20 mg Oral Tablet Take 1 Tablet (20 mg total) by mouth Once a day    fluticasone propionate (FLONASE) 50 mcg/actuation Nasal Spray, Suspension Administer 1 Spray into each nostril Once a day    methylphenidate HCl (RITALIN) 10 mg Oral Tablet Take 1 Tablet (10 mg total) by mouth Twice daily     Allergies   Allergen Reactions    Grass Pollen  Other Adverse Reaction (Add comment)     Sneezing and watery  eyes     Past Medical History:   Diagnosis Date    Depression     Migraine     Multiple sclerosis (CMS HCC)     Sinusitis          Past Surgical History:   Procedure Laterality Date    HX CHOLECYSTECTOMY      HX HIP REPLACEMENT      HX TONSILLECTOMY      SINUS SURGERY           Family Medical History:       Problem Relation (Age of Onset)    Cervical Cancer Mother    Diabetes Father    Stroke Mother, Father            Social History     Socioeconomic History  Marital status: Married     Spouse name: Barbara Cower   Tobacco Use    Smoking status: Former     Current packs/day: 0.00     Types: Cigarettes     Quit date: 01/04/2022     Years since quitting: 1.1    Smokeless tobacco: Never   Vaping Use    Vaping status: Former   Substance and Sexual Activity    Alcohol use: Yes    Drug use: Yes     Types: Marijuana    Sexual activity: Not Currently          Patient Active Problem List    Diagnosis Date Noted    Chronic sinusitis 02/01/2023    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  female that presents today for women's health visit.     ROS:  10 systems reviewed and were negative except as noted.     HPI:    LMP:  03/12/2023  Menstrual character:  Reports regular menstrual cycle.  Urogynecologic Symptoms:  Denies any symptoms or concerns.  UTI in the last 12months?:: No  Sexually active:  Not currently  Sexual dysfunction: No  Sexual Preference: Female  Hx of domestic or sexual abuse: no  Family Planning Discussed::  Not currently sexually active.  Prior hx of  STD?:: No    Reports suspicion regarding infertility:  Reports she was married for 23 years with no utilization of contraception and no pregnancy.  She did not investigate this further during her marriage because her husband at the time was not interested in children.   She is not currently sexually active.   Discussed if/when sexual activity resumes she should consider contraception unless pregnancy desired.      Breast cancer screening:  Positive family history of breast cancer in 2 maternal 1/2 aunts.  Reports baseline screening around age 87 at [facility] with no concerns.     Mother has history of cervical cancer with subsequent hysterectomy.     Abnormal mole:  Particularly dark mole on left breast.  Patient reports this is unchanged.  Multiple skin lesions that she has concerns about.  Discussed dermatology referral for skin survey which she is agreeable to pursue.      Physical Exam:    General: cooperative, healthy appearing and no acute distress  Orientation/Consciousness: patient oriented x3  HENMT:  Ears: hearing grossly normal bilaterally  General nose exam: nares normal and nasal mucous membranes and turbinates normal  Face and sinus: sinuses nontender  Mouth: oral mucosae normal  Throat: posterior oropharynx normal  Eyes:  General: appearance normal, both eyes and all related structures  Neck: normal visual inspection and no lymphadenopathy  Thyroid: thyroid normal  Carotids: no bruits  Chest: normal inspection of the chest  Breast/Axilla Inspection: normal inspection of the breasts and normal inspection of the axillae  Breast/Axilla Palpation: normal palpation of the breasts and no axillary lymphadenopathy  Resp:  Effort & Inspection: normal respiratory effort  Auscultation: clear to auscultation bilaterally, no crackles, no wheezes and no rubs  Cardio:  Jugular venous pressure: no JVD  Palpation: normal PMI  Rate: regular rate  Rhythm: regular rhythm  Heart Sounds: S1 normal, S2 normal, no click, no murmurs and no rubs  Bruits: no carotid  bruits  Pulses: normal peripheral pulses  GI:  Inspection: Yes normal to inspection  Palpation: soft, no hepatosplenomegaly, no guarding, no masses and nontender  Percussion: normal to percussion  Auscultation: normal bowel sounds  GU:  External  Female Exam: normal external appearance and normal appearance of the urethra  Urethra: normal appearance of the urethra  Speculum Exam - Vagina: normal appearance of the vagina and normal vaginal discharge  Speculum Exam - Cervix: normal appearance of the cervix and nontender  Bimanual Exam- Vagina & Uterus: normal bimanual exam, uterine mobility normal, No tender and non-tender  Bimanual Exam- Adnexa, other: no masses, no masses noted, No rectocele, No enterocele and No cystocele - Adnexal tenderness noted, particularly on the left  Pelvic Support: no cystocele noted, no enterocele noted and no retocele noted  Skin:  Lesions: no lesions  Rashes: no rashes  Neuro  General: patient oriented x3  Extrem:  General: normal to inspection, no clubbing, no cyanosis and no edema  Psych:  Mental Status: mental status grossly normal  Speech and Movement: speech and movement normal  Mood: congruent mood  Affect: normal affect  Attitude: cooperative  Insight: insight good  Judgment: judgment good      Objective :  BP 129/87 (Site: Left, Patient Position: Sitting, Cuff Size: Adult Large)   Pulse 85   Resp 16   Ht 1.651 m (5\' 5" )   Wt 102 kg (225 lb 8 oz)   SpO2 97%   BMI 37.53 kg/m           Data reviewed:    Assessment/Plan:   Assessment/Plan   1. Well woman exam with routine gynecological exam    2. Cervical cancer screening    3. Encounter for screening mammogram for malignant neoplasm of breast    4. Screening for HIV without presence of risk factors    5. Encounter for hepatitis C screening test for low risk patient    6. Atypical mole      Pap smear and pelvic exam completed with no abnormalities noted on physical exam.      Adnexal tenderness noted, particularly on the left during bimanual exam.  Pelvic ultrasound order submitted for additional evaluation.    Mammogram order submitted.    No prior screening for hepatitis-C or HIV, these order submitted as well to be drawn with next set of labs.      Further  treatment pending labs and diagnostics.  Follow-up with PCP as previously scheduled, sooner if issues or concerns.    Virgie Dad, FNP-BC

## 2023-03-15 NOTE — Nursing Note (Signed)
Patient here for annual pap.  Patient states that she is having some soreness in her lower abdomen.

## 2023-03-23 ENCOUNTER — Telehealth (RURAL_HEALTH_CENTER): Payer: Self-pay | Admitting: Family

## 2023-03-23 LAB — THIN PREP PAP AND HPV MRNA E6/E7 W/ REFLEX HPV 16,18/45 (QUEST): HPV MRNA E6/E7: NOT DETECTED

## 2023-03-23 NOTE — Telephone Encounter (Signed)
-----   Message from Virgie Dad, FNP-BC sent at 03/23/2023  4:13 PM EDT -----  Let her know that her Pap smear results are within normal limits.  Her HPV testing was negative.

## 2023-03-23 NOTE — Telephone Encounter (Signed)
Patient notified

## 2023-04-06 ENCOUNTER — Other Ambulatory Visit (HOSPITAL_COMMUNITY): Payer: Self-pay

## 2023-04-06 IMAGING — MR MRI BRAIN WITHOUT AND WITH CONTRAST
9 of 13 series · 30 of 48 positions shown · IV contrast (gadavist)
Comparison: MRI brain dated 03/30/2021.

﻿EXAM:  [id number]   MRI BRAIN WITHOUT AND WITH CONTRAST
INDICATION: History of multiple sclerosis, follow-up.  No new symptoms.
TECHNIQUE: Axial, coronal and sagittal images following MS protocol including postcontrast images after administration of 5 mL Gadavist IV.

[Series 5: DWI · axial · 5.0mm · 1.35mm/px · z∈[-57,+69]mm · 8 of 88 slices shown (1 of 3)]
[im 1/88]
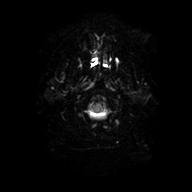
[im 13/88]
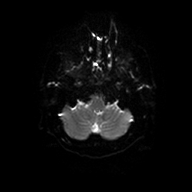
[im 25/88]
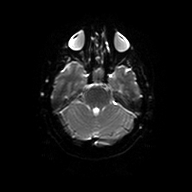
[im 38/88]
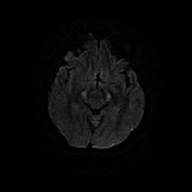
[im 50/88]
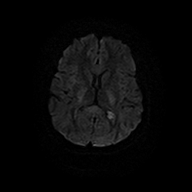
[im 63/88]
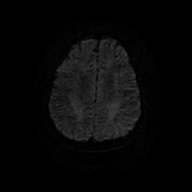
[im 75/88]
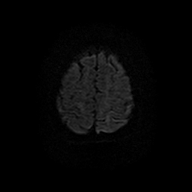
[im 88/88]
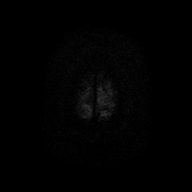

[Series 6: DWI · axial · 5.0mm · 1.35mm/px · 1 of 22 slices shown (2 of 3)]
[im 1/22]
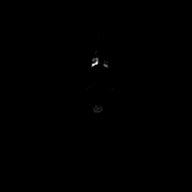

[Series 7: DWI · axial · 5.0mm · 1.35mm/px · z∈[-57,+69]mm · 2 of 20 slices shown (3 of 3)]
[im 1/20]
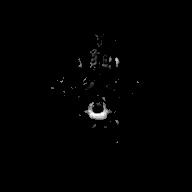
[im 20/20]
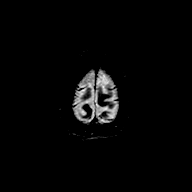

[Series 8: FLAIR · sagittal · 5.0mm · 0.75mm/px · 3 of 32 slices shown (1 of 2)]
[im 1/32]
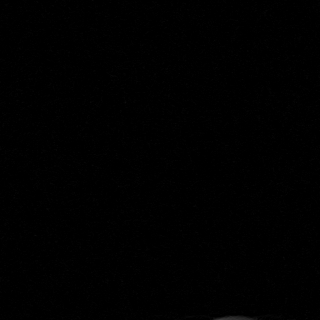
[im 16/32]
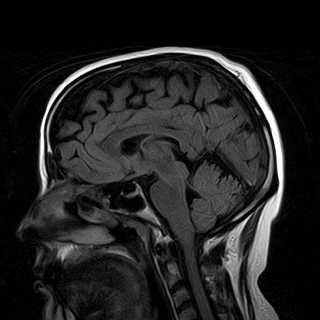
[im 32/32]
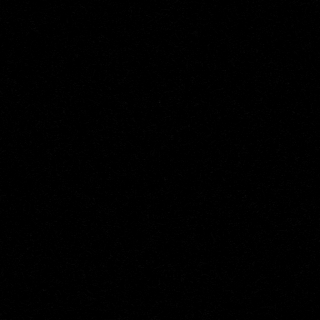

[Series 11: T1 · axial · 4.0mm · 0.43mm/px · z∈[-70,+84]mm · 4 of 36 slices shown]
[im 1/36]
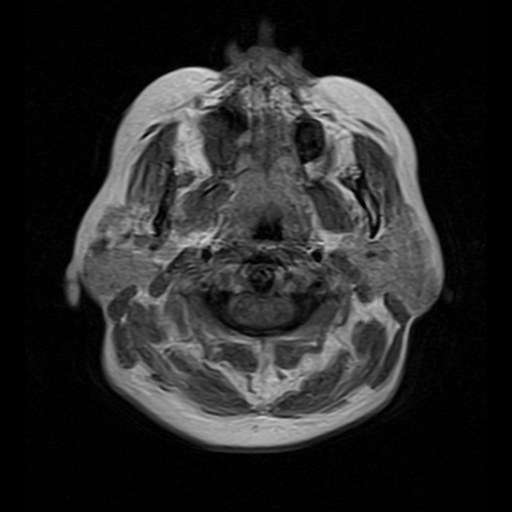
[im 12/36]
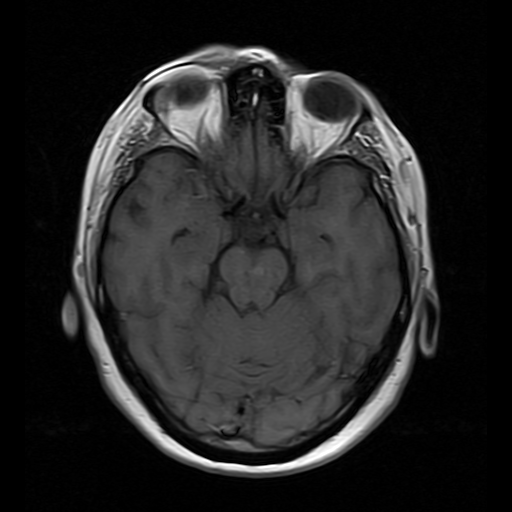
[im 24/36]
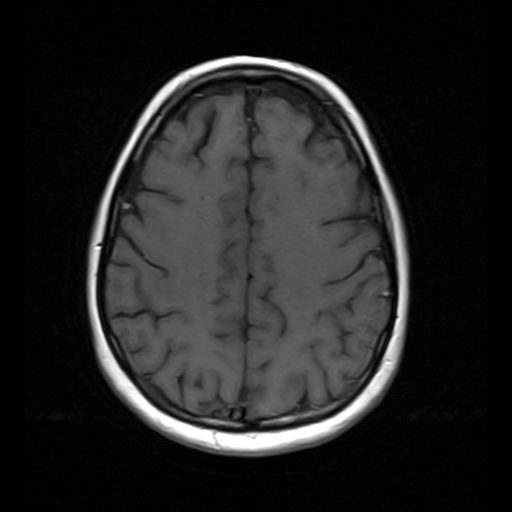
[im 36/36]
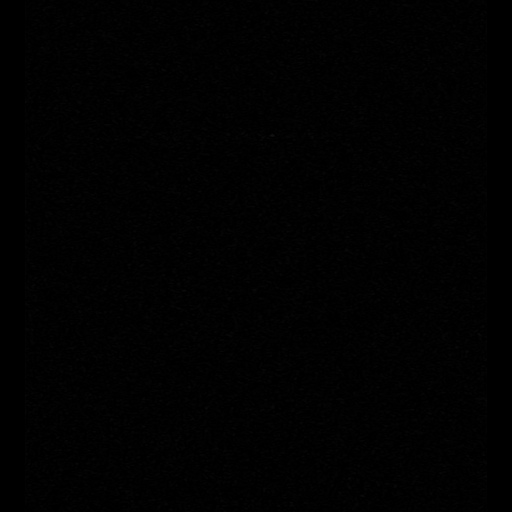

[Series 12: T2 · coronal · 5.0mm · 0.43mm/px · 3 of 28 slices shown]
[im 1/28]
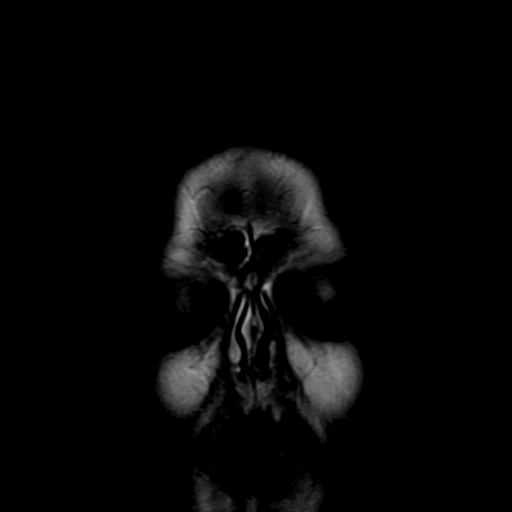
[im 14/28]
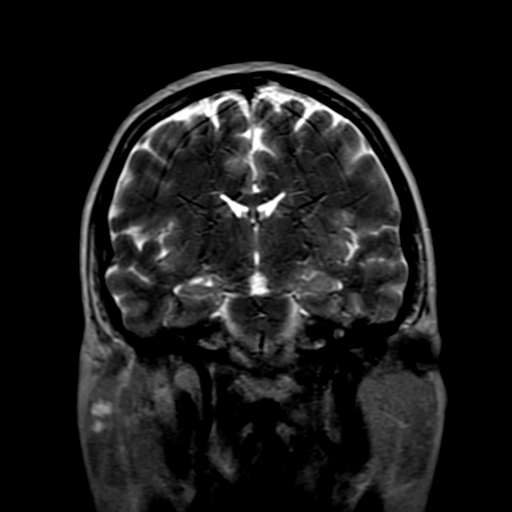
[im 28/28]
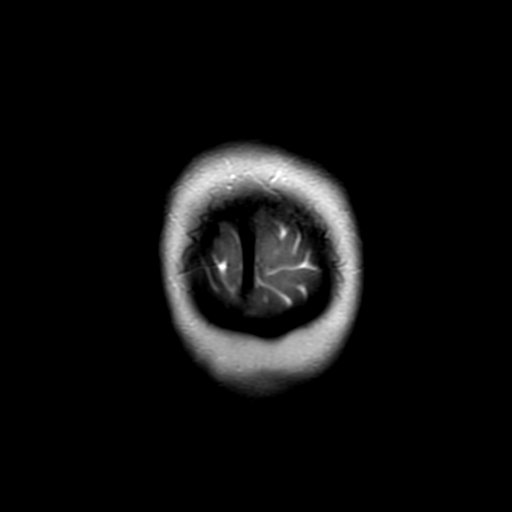

[Series 13: T1 fat-sat · axial · 4.0mm · 0.43mm/px · 1 of 36 slices shown]
[im 1/36]
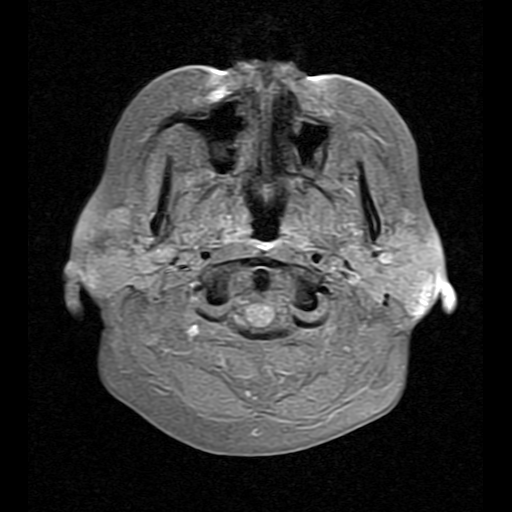

[Series 14: FLAIR · axial · 4.0mm · 0.43mm/px · z∈[-70,+84]mm · 4 of 36 slices shown (2 of 2)]
[im 1/36]
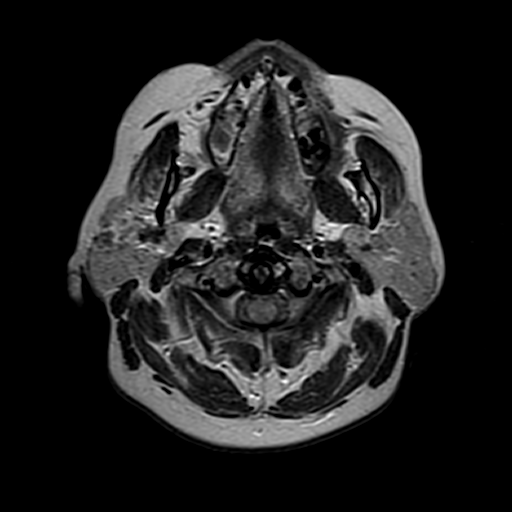
[im 12/36]
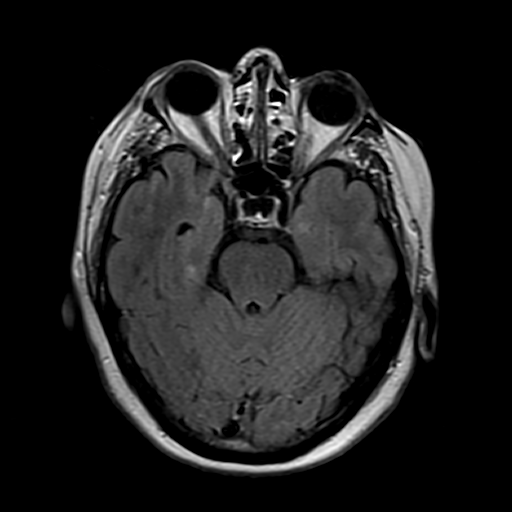
[im 24/36]
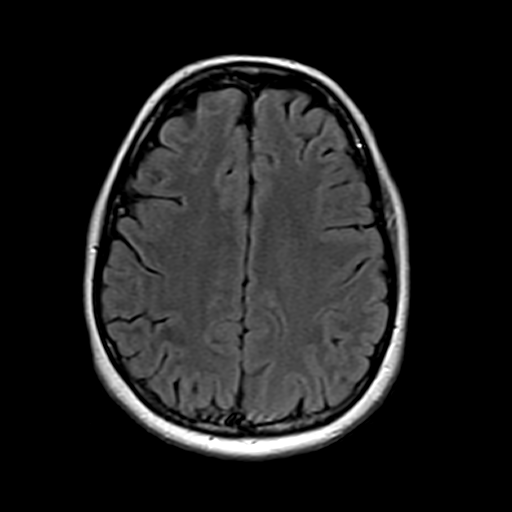
[im 36/36]
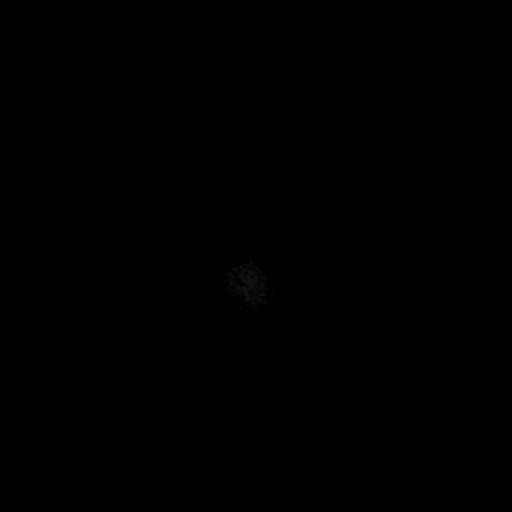

[Series 17: T1 post-contrast · axial · 4.0mm · 0.43mm/px · z∈[-70,+84]mm · 4 of 36 slices shown]
[im 1/36]
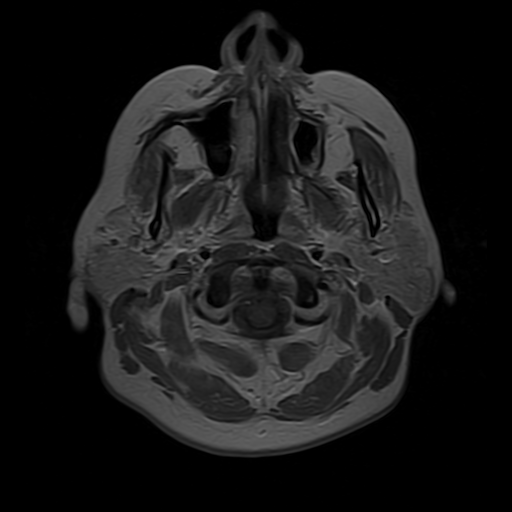
[im 12/36]
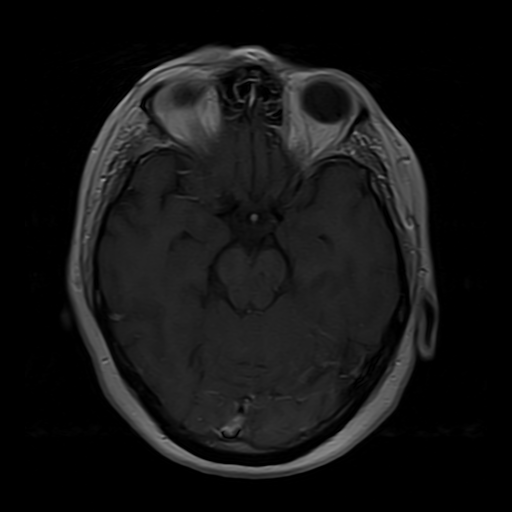
[im 24/36]
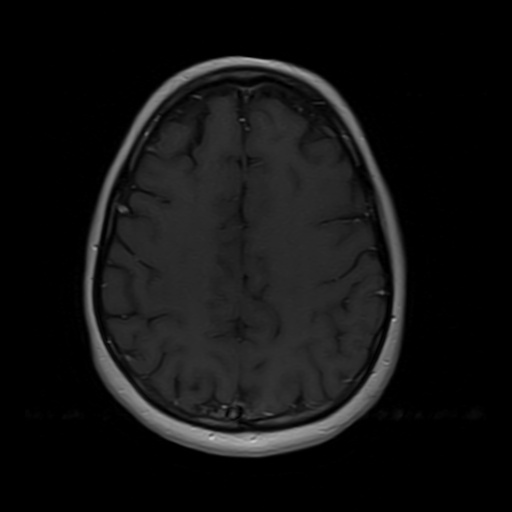
[im 36/36]
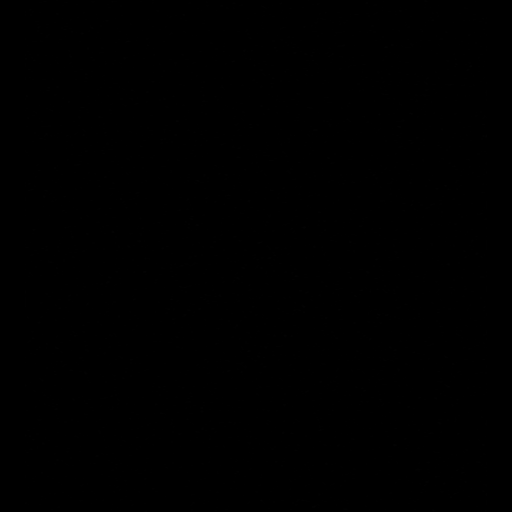

[30 of 48 positions shown; findings below may reference images not displayed]

FINDINGS: No focal areas of restricted diffusion.  No intracranial space-occupying lesions are seen.  No abnormal enhancement on postcontrast study. Stable benign cystic lesion in the subinsular region on the left side 1.5 cm in diameter.

No significant FLAIR signal abnormalities are seen in the periventricular white matter.  No FLAIR signal abnormalities of corpus callosum, brainstem and cerebellum are seen.  A 12 mm length FLAIR signal increase at C2 level involving cervical cord consistent with known area of demyelination in the cervical cord similar to prior imaging studies.  No abnormal enhancement on postcontrast study.  Major arteries of circle of Willis and dural venous sinuses are patent.

Sinusitis involving ethmoid sinuses and maxillary sinuses on both sides.
IMPRESSION: 1. No demyelinating lesions of the brain, brainstem and cerebellum are seen.

2. Stable FLAIR signal lesion at C2 level involving cervical spinal cord similar to prior imaging studies.

3. Sinusitis of maxillary and ethmoid sinuses.

## 2023-04-06 IMAGING — MR MRI CERVICAL SPINE WITHOUT AND WITH CONTRAST
9 series · 48 of 48 positions shown · IV contrast (gadavist)
Comparison: MRI C-spine dated 03/30/2021.

﻿EXAM:  [id number]   MRI CERVICAL SPINE WITHOUT AND WITH CONTRAST
INDICATION: Multiple sclerosis.  Follow-up.  No new symptoms.
TECHNIQUE: Multiplanar, multisequential MRI of the C-spine was performed, including fat-suppressed postcontrast series after administration of 5 mL Gadavist IV.

[Series 5: T2 · sagittal · 3.0mm · 0.75mm/px · 2 of 13 slices shown (1 of 2)]
[im 1/13]
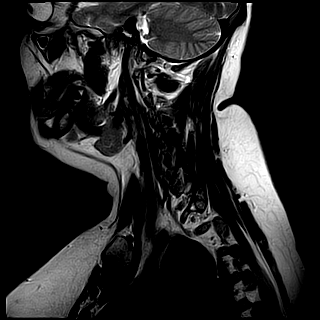
[im 13/13]
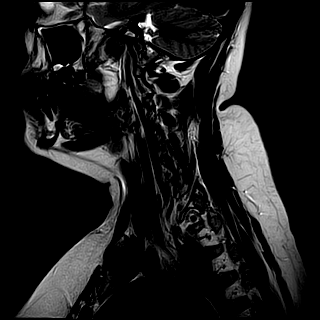

[Series 6: T1 · sagittal · 3.0mm · 0.47mm/px · 2 of 13 slices shown]
[im 1/13]
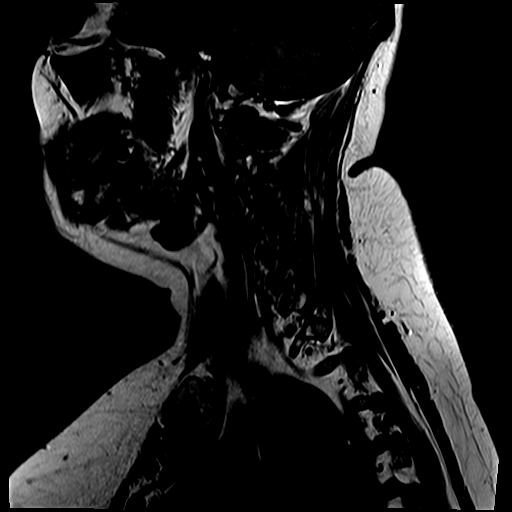
[im 13/13]
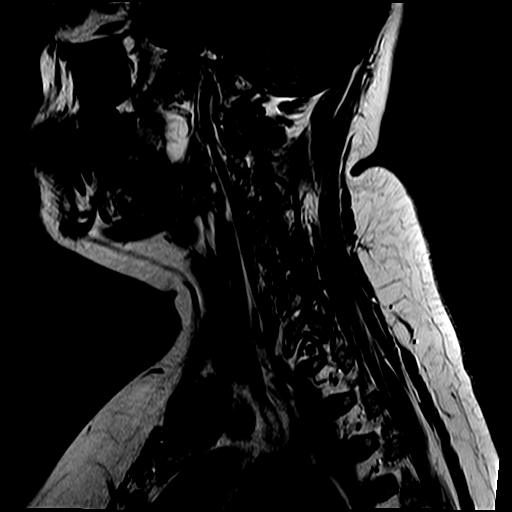

[Series 7: STIR · sagittal · 3.0mm · 0.47mm/px · 3 of 13 slices shown]
[im 1/13]
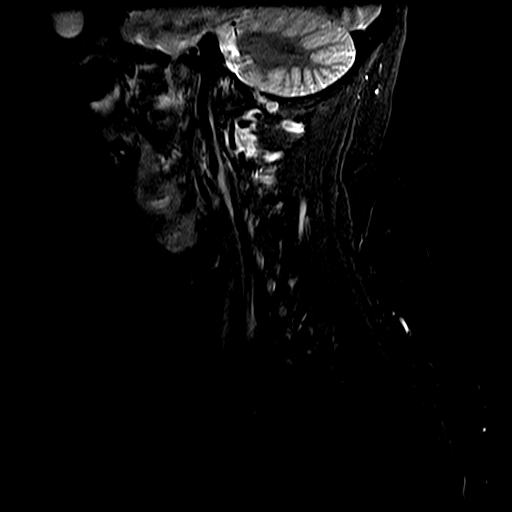
[im 7/13]
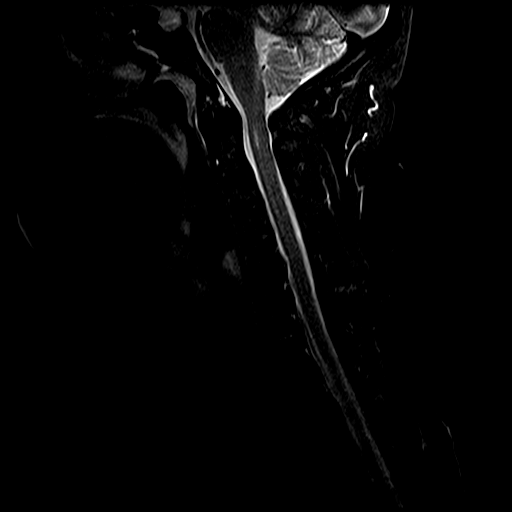
[im 13/13]
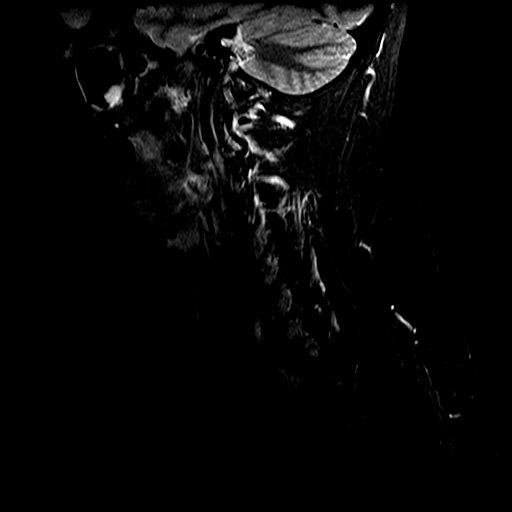

[Series 8: T1 fat-sat · sagittal · 3.0mm · 0.75mm/px · 3 of 13 slices shown (1 of 2)]
[im 1/13]
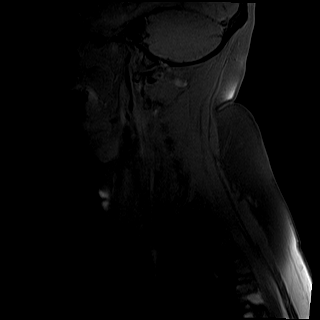
[im 7/13]
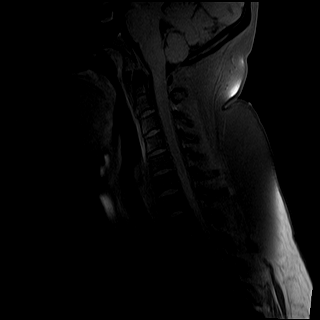
[im 13/13]
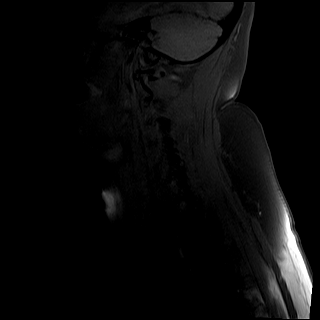

[Series 9: T2 · axial · 3.0mm · 0.35mm/px · z∈[-78,+19]mm · 5 of 26 slices shown (2 of 2)]
[im 1/26]
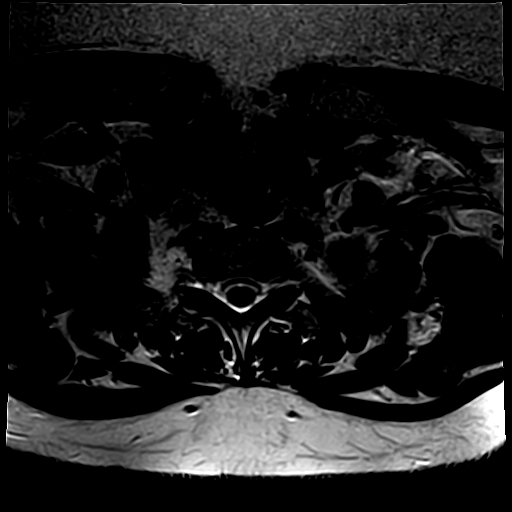
[im 7/26]
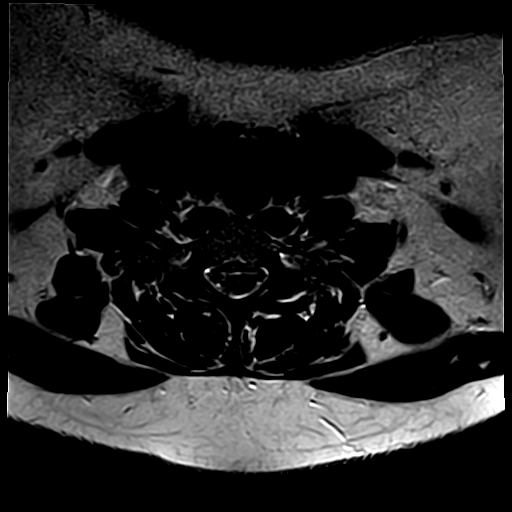
[im 13/26]
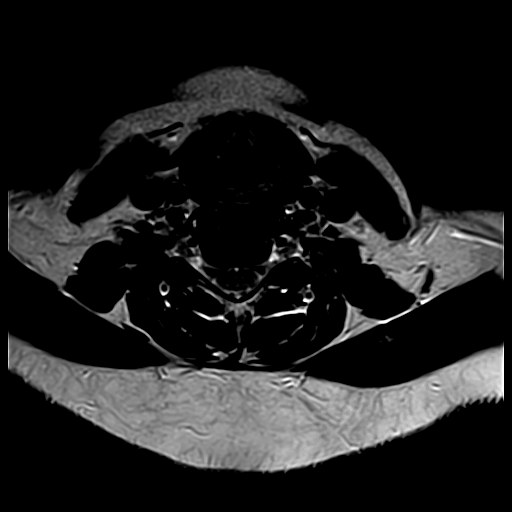
[im 19/26]
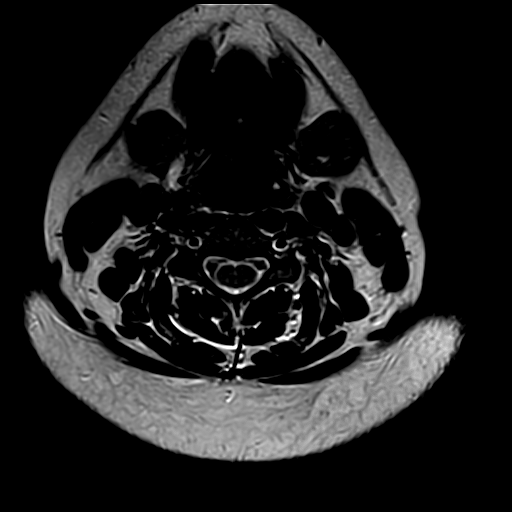
[im 26/26]
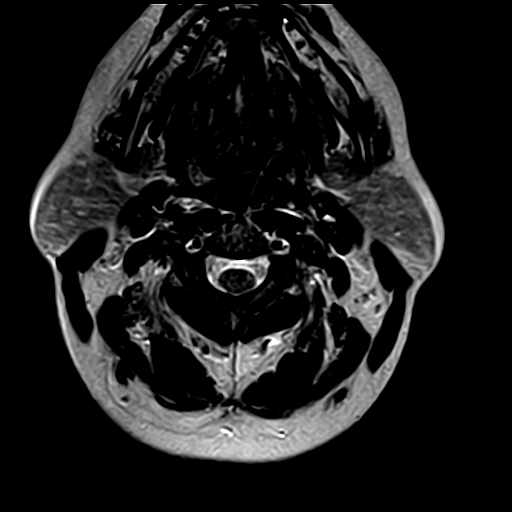

[Series 10: T1 fat-sat · axial · 3.0mm · 0.70mm/px · z∈[-78,+19]mm · 5 of 26 slices shown (2 of 2)]
[im 1/26]
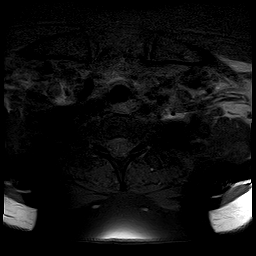
[im 7/26]
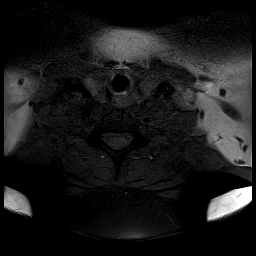
[im 13/26]
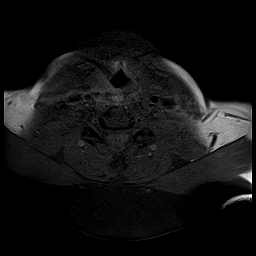
[im 19/26]
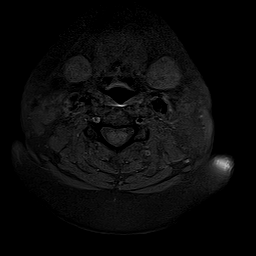
[im 26/26]
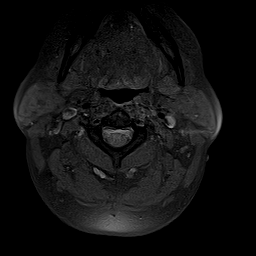

[Series 14: (id) · sagittal · 8.8mm · 4.38mm/px · 20 of 100 slices shown]
[im 1/100]
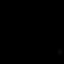
[im 6/100]
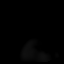
[im 11/100]
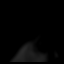
[im 16/100]
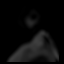
[im 21/100]
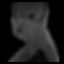
[im 27/100]
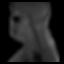
[im 32/100]
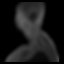
[im 37/100]
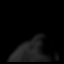
[im 42/100]
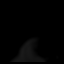
[im 47/100]
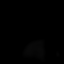
[im 53/100]
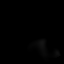
[im 58/100]
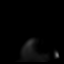
[im 63/100]
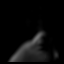
[im 68/100]
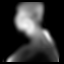
[im 73/100]
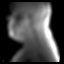
[im 79/100]
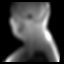
[im 84/100]
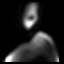
[im 89/100]
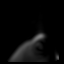
[im 94/100]
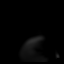
[im 100/100]
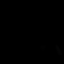

[Series 15: T1 fat-sat post-contrast · sagittal · 3.0mm · 0.75mm/px · 3 of 13 slices shown (1 of 2)]
[im 1/13]
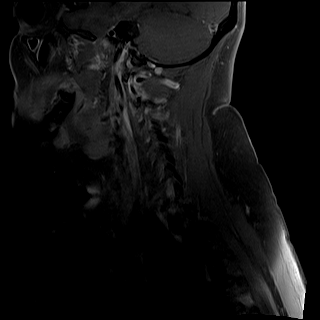
[im 7/13]
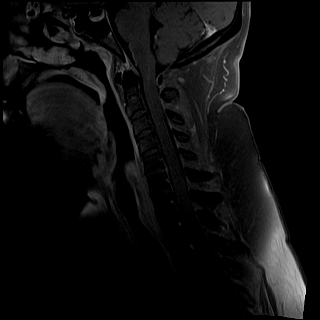
[im 13/13]
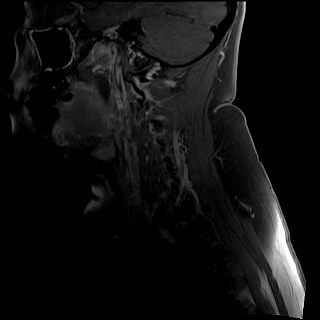

[Series 16: T1 fat-sat post-contrast · axial · 3.0mm · 0.70mm/px · z∈[-105,-11]mm · 5 of 26 slices shown (2 of 2)]
[im 1/26]
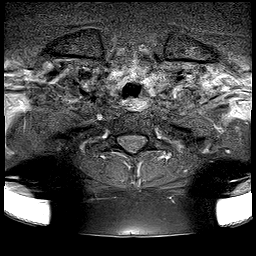
[im 7/26]
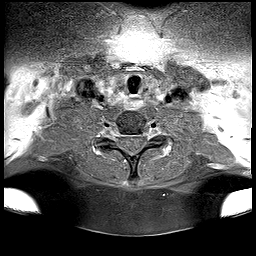
[im 13/26]
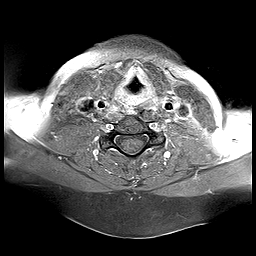
[im 19/26]
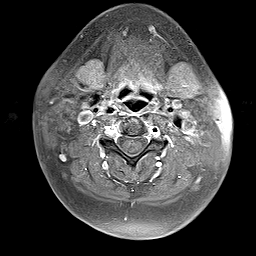
[im 26/26]
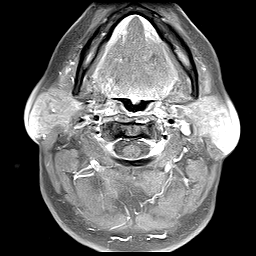

[48 of 48 positions shown; findings below may reference images not displayed]

FINDINGS: Previously noted 12 mm length of T2 FLAIR signal increase is noted within the cervical spinal cord at C2 level.  No enhancement is noted on postcontrast study.  No new or additional lesions are seen. 

No evidence of cervical disc herniation or central spinal stenosis.  Mild bulging annulus at C5-6 level minimally impinging on thecal sac in the midline.  No evidence of foraminal narrowing. Paravertebral soft tissues are unremarkable
IMPRESSION: 1. Stable focus of demyelination at C2 level, 12 mm in length.  No new or additional lesions are seen. 

2. No abnormal enhancement on postcontrast study.

3. Mild degenerative disc changes at C5-6 level as described above.

## 2023-05-10 ENCOUNTER — Ambulatory Visit
Admission: RE | Admit: 2023-05-10 | Discharge: 2023-05-10 | Disposition: A | Discharge status: Home / Self Care | Payer: BC Managed Care – PPO | Source: Ambulatory Visit | Attending: Family | Admitting: Family

## 2023-05-10 ENCOUNTER — Ambulatory Visit (HOSPITAL_BASED_OUTPATIENT_CLINIC_OR_DEPARTMENT_OTHER)
Admission: RE | Admit: 2023-05-10 | Discharge: 2023-05-10 | Disposition: A | Discharge status: Home / Self Care | Payer: BC Managed Care – PPO | Source: Ambulatory Visit | Attending: Family | Admitting: Family

## 2023-05-10 ENCOUNTER — Encounter (HOSPITAL_COMMUNITY): Payer: Self-pay

## 2023-05-10 ENCOUNTER — Other Ambulatory Visit: Payer: Self-pay

## 2023-05-10 DIAGNOSIS — Z1231 Encounter for screening mammogram for malignant neoplasm of breast: Secondary | ICD-10-CM

## 2023-05-10 DIAGNOSIS — R102 Pelvic and perineal pain: Secondary | ICD-10-CM | POA: Insufficient documentation

## 2023-05-11 ENCOUNTER — Telehealth (RURAL_HEALTH_CENTER): Payer: Self-pay | Admitting: Family

## 2023-05-11 NOTE — Telephone Encounter (Signed)
Left message for patient to return call to the office.     See other message about mammo.

## 2023-05-11 NOTE — Telephone Encounter (Signed)
-----   Message from Jamie Savage sent at 05/10/2023 10:09 PM EDT -----  Let her know that her pelvic ultrasound shows a uterine fibroid. These are not uncommon and don't require additional eval or treatment unless she has symptoms such as heavy periods or pelvic pain.  Tell her to let us know if she has any new symptoms or concerns.  Otherwise, the ultrasound was normal.

## 2023-05-11 NOTE — Telephone Encounter (Signed)
-----   Message from Virgie Dad sent at 05/10/2023  9:59 PM EDT -----  Let her know that her mammogram is normal with no suspicious findings. We will plan to repeat in 1 year.

## 2023-05-11 NOTE — Telephone Encounter (Signed)
Patient notified

## 2023-05-11 NOTE — Telephone Encounter (Signed)
 Left message for patient to return call to the office.    **See other message.

## 2023-06-22 ENCOUNTER — Ambulatory Visit (RURAL_HEALTH_CENTER): Payer: BC Managed Care – PPO | Attending: Family | Admitting: INTERNAL MEDICINE

## 2023-06-22 ENCOUNTER — Encounter (RURAL_HEALTH_CENTER): Payer: Self-pay | Admitting: INTERNAL MEDICINE

## 2023-06-22 ENCOUNTER — Other Ambulatory Visit: Payer: Self-pay

## 2023-06-22 VITALS — BP 131/84 | HR 88 | Resp 18 | Ht 65.0 in | Wt 223.0 lb

## 2023-06-22 DIAGNOSIS — Z202 Contact with and (suspected) exposure to infections with a predominantly sexual mode of transmission: Secondary | ICD-10-CM | POA: Insufficient documentation

## 2023-06-22 MED ORDER — DOXYCYCLINE HYCLATE 100 MG CAPSULE
100.0000 mg | ORAL_CAPSULE | Freq: Two times a day (BID) | ORAL | 0 refills | Status: AC
Start: 2023-06-22 — End: 2023-07-02

## 2023-06-22 MED ORDER — LIDOCAINE HCL 10 MG/ML (1 %) INJECTION SOLUTION
500.0000 mg | INTRAMUSCULAR | Status: AC
Start: 2023-06-22 — End: 2023-06-22
  Administered 2023-06-22: 500 mg via INTRAMUSCULAR

## 2023-06-22 NOTE — Nursing Note (Signed)
Patient is here for her follow up. Patient reports she was sexually active about 2 weeks ago and the man was diagnosed with gonorrhea and chlamydia. Patient would like to be tested and treated for this. She has no symptoms.

## 2023-06-22 NOTE — Progress Notes (Signed)
[facility]  [address]  [place], [place]  [address]  Phone: [phone] Fax: [phone]    Name: Jamie Savage                       Date of Birth: 09-23-1980   MRN:  [id number]                         Date of visit: 06/22/2023     Chief Complaint: Follow Up 3 Months (C/o exposed to chlamydia and gonorrhea. Patient not experiencing any symptoms. )    Current Outpatient Medications   Medication Sig    AJOVY AUTOINJECTOR 225 mg/1.5 mL Subcutaneous Auto-Injector INJECT1.5ML ONCE EVERY MONTH    cholecalciferol, vitamin D3, 1,250 mcg (50,000 unit) Oral Capsule Take 1 Capsule (50,000 Units total) by mouth Every 7 days    COPAXONE 40 mg/mL Subcutaneous Syringe INJECT 1 ML THREE TIMES A WEEK SUB-Q    cyanocobalamin (VITAMIN B 12) 1,000 mcg Oral Tablet Take 1 Tablet (1,000 mcg total) by mouth Once a day    doxycycline hyclate (VIBRAMYCIN) 100 mg Oral Capsule Take 1 Capsule (100 mg total) by mouth Twice daily for 10 days    escitalopram oxalate (LEXAPRO) 20 mg Oral Tablet Take 1 Tablet (20 mg total) by mouth Once a day    fluticasone propionate (FLONASE) 50 mcg/actuation Nasal Spray, Suspension Administer 1 Spray into each nostril Once a day    methylphenidate HCl (RITALIN) 10 mg Oral Tablet Take 1 Tablet (10 mg total) by mouth Twice daily       Patient Active Problem List    Diagnosis Date Noted    Chronic sinusitis 02/01/2023    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     Patient notes she had unprotected sex in late September. A few days ago the man called her and told her he tested positive for gonorrhea and chlamydia. Patient is completely asymptomatic. She is not sure if he had intercourse with anyone between September and now.   States she gave him a condom and did not realize he didn't use it until it was too late.     ROS:  10 systems  reviewed and were negative except as noted.   + fatigue      Objective :  BP 131/84   Pulse 88   Resp 18   Ht 1.651 m (5\' 5" )   Wt 101 kg (223 lb)   SpO2 98%   BMI 37.11 kg/m       General:  appears in good health, comfortable  Lungs:  Clear to auscultation bilaterally.   Cardiovascular:  regular rate and rhythm, S1, S2 normal, no murmur, click, rub or gallop  Neurologic:  Grossly normal. Alert and oriented x3  Psychiatric:  Normal    Data reviewed:    Assessment/Plan:  Assessment/Plan   1. Exposure to STD      Will do prophylactic dose of rocephin and doxycycline.  Counselign and education provided.       Terald Sleeper, DO, [facility]  Internal Medicine

## 2023-06-23 ENCOUNTER — Ambulatory Visit (RURAL_HEALTH_CENTER): Payer: Self-pay | Admitting: INTERNAL MEDICINE

## 2023-07-22 ENCOUNTER — Other Ambulatory Visit: Payer: Self-pay

## 2023-08-10 NOTE — Progress Notes (Signed)
Lab visit only. 

## 2023-08-18 ENCOUNTER — Other Ambulatory Visit: Payer: Self-pay

## 2023-08-18 ENCOUNTER — Telehealth (RURAL_HEALTH_CENTER): Payer: Self-pay | Admitting: INTERNAL MEDICINE

## 2023-08-18 ENCOUNTER — Ambulatory Visit (RURAL_HEALTH_CENTER): Payer: BC Managed Care – PPO | Attending: INTERNAL MEDICINE

## 2023-08-18 ENCOUNTER — Other Ambulatory Visit (RURAL_HEALTH_CENTER): Payer: Self-pay | Admitting: INTERNAL MEDICINE

## 2023-08-18 DIAGNOSIS — G43909 Migraine, unspecified, not intractable, without status migrainosus: Secondary | ICD-10-CM

## 2023-08-18 DIAGNOSIS — E559 Vitamin D deficiency, unspecified: Secondary | ICD-10-CM | POA: Insufficient documentation

## 2023-08-18 DIAGNOSIS — G35 Multiple sclerosis: Secondary | ICD-10-CM | POA: Insufficient documentation

## 2023-08-18 DIAGNOSIS — R5383 Other fatigue: Secondary | ICD-10-CM | POA: Insufficient documentation

## 2023-08-18 DIAGNOSIS — R413 Other amnesia: Secondary | ICD-10-CM | POA: Insufficient documentation

## 2023-08-18 DIAGNOSIS — R739 Hyperglycemia, unspecified: Secondary | ICD-10-CM | POA: Insufficient documentation

## 2023-08-18 LAB — CBC WITH DIFF
BASOPHIL #: 0.1 10*3/uL (ref 0.00–0.10)
BASOPHIL %: 2 % — ABNORMAL HIGH (ref 0–1)
EOSINOPHIL #: 0.5 10*3/uL (ref 0.00–0.50)
EOSINOPHIL %: 6 % (ref 1–7)
HCT: 41.1 % (ref 31.2–41.9)
HGB: 13.6 g/dL (ref 10.9–14.3)
LYMPHOCYTE #: 2.8 10*3/uL (ref 1.00–3.00)
LYMPHOCYTE %: 28 % (ref 16–44)
MCH: 28.6 pg (ref 24.7–32.8)
MCHC: 33.2 g/dL (ref 32.3–35.6)
MCV: 86.2 fL (ref 75.5–95.3)
MONOCYTE #: 0.5 10*3/uL (ref 0.30–1.00)
MONOCYTE %: 5 % (ref 5–13)
MPV: 9.2 fL (ref 7.9–10.8)
NEUTROPHIL #: 5.9 10*3/uL (ref 1.85–7.80)
NEUTROPHIL %: 60 % (ref 43–77)
PLATELETS: 405 10*3/uL (ref 140–440)
RBC: 4.77 10*6/uL (ref 3.63–4.92)
RDW: 13.9 % (ref 12.3–17.7)
WBC: 9.9 10*3/uL (ref 3.8–11.8)

## 2023-08-18 LAB — COMPREHENSIVE METABOLIC PANEL, NON-FASTING
ALBUMIN/GLOBULIN RATIO: 1.3 (ref 0.8–1.4)
ALBUMIN: 4.3 g/dL (ref 3.5–5.7)
ALKALINE PHOSPHATASE: 51 U/L (ref 34–104)
ALT (SGPT): 16 U/L (ref 7–52)
ANION GAP: 6 mmol/L (ref 4–13)
AST (SGOT): 15 U/L (ref 13–39)
BILIRUBIN TOTAL: 0.3 mg/dL (ref 0.3–1.0)
BUN/CREA RATIO: 19 (ref 6–22)
BUN: 19 mg/dL (ref 7–25)
CALCIUM, CORRECTED: 9.1 mg/dL (ref 8.9–10.8)
CALCIUM: 9.3 mg/dL (ref 8.6–10.3)
CHLORIDE: 104 mmol/L (ref 98–107)
CO2 TOTAL: 28 mmol/L (ref 21–31)
CREATININE: 1 mg/dL (ref 0.60–1.30)
ESTIMATED GFR: 72 mL/min/{1.73_m2} (ref >59)
GLOBULIN: 3.2 (ref 2.0–3.5)
GLUCOSE: 95 mg/dL (ref 74–109)
OSMOLALITY, CALCULATED: 278 mosm/kg (ref 270–290)
POTASSIUM: 4.4 mmol/L (ref 3.5–5.1)
PROTEIN TOTAL: 7.5 g/dL (ref 6.4–8.9)
SODIUM: 138 mmol/L (ref 136–145)

## 2023-08-18 LAB — LIPID PANEL
CHOL/HDL RATIO: 2.5
CHOLESTEROL: 142 mg/dL (ref <200)
HDL CHOL: 57 mg/dL (ref >=40)
LDL CALC: 71 mg/dL (ref 0–100)
TRIGLYCERIDES: 71 mg/dL (ref <=150)
VLDL CALC: 14 mg/dL (ref 0–50)

## 2023-08-18 LAB — THYROID STIMULATING HORMONE (SENSITIVE TSH): TSH: 1.236 u[IU]/mL (ref 0.450–5.330)

## 2023-08-18 LAB — VITAMIN D 25 TOTAL: VITAMIN D 25, TOTAL: 46.69 ng/mL (ref 30.00–100.00)

## 2023-08-18 LAB — VITAMIN B12: VITAMIN B 12: 639 pg/mL (ref 180–914)

## 2023-08-18 LAB — HGA1C (HEMOGLOBIN A1C WITH EST AVG GLUCOSE): HEMOGLOBIN A1C: 5.9 % (ref 4.0–6.0)

## 2023-08-18 NOTE — Telephone Encounter (Signed)
Patient was in the office asking for a referral to a neurologist. She stated that Dr. Aretha Parrot is retiring and she has to find a new neurologist.

## 2023-08-18 NOTE — Telephone Encounter (Signed)
Dr. Aretha Parrot is retiring and patient is asking for a referral to Dr. Andee Poles.

## 2023-09-13 ENCOUNTER — Other Ambulatory Visit (INDEPENDENT_AMBULATORY_CARE_PROVIDER_SITE_OTHER): Payer: Self-pay | Admitting: INTERNAL MEDICINE

## 2023-09-13 DIAGNOSIS — G43E09 Chronic migraine with aura, not intractable, without status migrainosus: Secondary | ICD-10-CM

## 2023-09-13 DIAGNOSIS — G35 Multiple sclerosis: Secondary | ICD-10-CM

## 2023-09-21 ENCOUNTER — Other Ambulatory Visit (INDEPENDENT_AMBULATORY_CARE_PROVIDER_SITE_OTHER): Payer: Self-pay | Admitting: INTERNAL MEDICINE

## 2023-09-21 DIAGNOSIS — G35 Multiple sclerosis: Secondary | ICD-10-CM

## 2023-09-21 DIAGNOSIS — G43E09 Chronic migraine with aura, not intractable, without status migrainosus: Secondary | ICD-10-CM

## 2023-10-09 ENCOUNTER — Emergency Department
Admission: EM | Admit: 2023-10-09 | Discharge: 2023-10-09 | Disposition: A | Discharge status: Home / Self Care | Payer: BC Managed Care – PPO | Attending: NURSE PRACTITIONER | Admitting: NURSE PRACTITIONER

## 2023-10-09 ENCOUNTER — Other Ambulatory Visit: Payer: Self-pay

## 2023-10-09 DIAGNOSIS — Z23 Encounter for immunization: Secondary | ICD-10-CM

## 2023-10-09 DIAGNOSIS — S61451A Open bite of right hand, initial encounter: Secondary | ICD-10-CM

## 2023-10-09 DIAGNOSIS — W540XXA Bitten by dog, initial encounter: Secondary | ICD-10-CM

## 2023-10-09 DIAGNOSIS — S61011A Laceration without foreign body of right thumb without damage to nail, initial encounter: Secondary | ICD-10-CM | POA: Insufficient documentation

## 2023-10-09 MED ORDER — DIPHTH,PERTUSSIS(ACEL),TETANUS 2.5 LF UNIT-8 MCG-5 LF/0.5ML IM SYRINGE
0.5000 mL | INJECTION | INTRAMUSCULAR | Status: AC
Start: 2023-10-09 — End: 2023-10-09
  Administered 2023-10-09: 0.5 mL via INTRAMUSCULAR

## 2023-10-09 MED ORDER — CEFTRIAXONE 1 GRAM SOLUTION FOR INJECTION
INTRAMUSCULAR | Status: AC
Start: 2023-10-09 — End: 2023-10-09
  Filled 2023-10-09: qty 10

## 2023-10-09 MED ORDER — KETOROLAC 30 MG/ML (1 ML) INJECTION SOLUTION
INTRAMUSCULAR | Status: AC
Start: 2023-10-09 — End: 2023-10-09
  Filled 2023-10-09: qty 1

## 2023-10-09 MED ORDER — MUPIROCIN 2 % TOPICAL OINTMENT
TOPICAL_OINTMENT | Freq: Two times a day (BID) | CUTANEOUS | 0 refills | Status: DC
Start: 2023-10-09 — End: 2024-02-14

## 2023-10-09 MED ORDER — LIDOCAINE HCL 10 MG/ML (1 %) INJECTION SOLUTION
INTRAMUSCULAR | Status: AC
Start: 2023-10-09 — End: 2023-10-09
  Filled 2023-10-09: qty 50

## 2023-10-09 MED ORDER — AMOXICILLIN 875 MG-POTASSIUM CLAVULANATE 125 MG TABLET
1.0000 | ORAL_TABLET | Freq: Two times a day (BID) | ORAL | 0 refills | Status: DC
Start: 2023-10-09 — End: 2023-11-11

## 2023-10-09 MED ORDER — LIDOCAINE HCL 10 MG/ML (1 %) INJECTION SOLUTION
1000.0000 mg | Freq: Once | INTRAMUSCULAR | Status: AC
Start: 2023-10-09 — End: 2023-10-09
  Administered 2023-10-09: 1000 mg via INTRAMUSCULAR

## 2023-10-09 MED ORDER — KETOROLAC 30 MG/ML (1 ML) INJECTION SOLUTION
30.0000 mg | INTRAMUSCULAR | Status: AC
Start: 2023-10-09 — End: 2023-10-09
  Administered 2023-10-09: 30 mg via INTRAMUSCULAR

## 2023-10-09 MED ORDER — DIPHTH,PERTUSSIS(ACEL),TETANUS 2.5 LF UNIT-8 MCG-5 LF/0.5ML IM SYRINGE
INJECTION | INTRAMUSCULAR | Status: AC
Start: 2023-10-09 — End: 2023-10-09
  Filled 2023-10-09: qty 0.5

## 2023-10-09 NOTE — Discharge Instructions (Signed)
Continue to monitor site.  Watch for signs of infection including increased redness, increased swelling, increased drainage or bleeding, increased pain or fever.  Use Tylenol or Motrin as needed for discomfort.  Wash site daily with soap and water.  Use prescribed ointment.  Take antibiotic as prescribed.  Follow-up with your family doctor within 1 week.  Return to ER for recheck within 24-48 hours or worsening symptoms.

## 2023-10-09 NOTE — ED Nurses Note (Signed)
Wound cleansed and dry dressing applied. Pt evaluated and treated by medical provider while in waiting room area. No primary RN assigned; therefore, no assessment performed. All paperwork given and questions answered. Pt acknowledged all understanding. Leaving waiting area via ambulation

## 2023-10-09 NOTE — ED Triage Notes (Signed)
Dog bite to rt hand pts dog puncture wounds thumb area

## 2023-10-09 NOTE — ED Nurses Note (Signed)
Given animal bite form

## 2023-10-09 NOTE — ED Provider Notes (Signed)
[facility]  Emergency Department  Advanced Practice Provider Note      CHIEF COMPLAINT  Chief Complaint   Patient presents with    Dog Bite     HISTORY OF PRESENT ILLNESS  Jamie Savage, date of birth 08-13-1981, is a 43 y.o. female who presented to the Emergency Department.    Patient is a 43 year old female, who presents to the ED after an accidental dog bite to the right hand.  Patient was bit by her pet Jersey.  Patient does have 2 small lacerations noted, 1 to the thumb and 1 to the dorsal side of the hand.  No bleeding noted.  Patient denies any motor sensory deficit.  Denies any home treatment prior to ER arrival.  Posey Rea of last tetanus.  Patient given animal encounter form to fill out.    PAST MEDICAL/SURGICAL/FAMILY/SOCIAL HISTORY  Past Medical History:   Diagnosis Date    Depression     Migraine     Multiple sclerosis (CMS HCC)     Sinusitis        Past Surgical History:   Procedure Laterality Date    HX CHOLECYSTECTOMY      HX HIP REPLACEMENT      HX TONSILLECTOMY      SINUS SURGERY         Family Medical History:       Problem Relation (Age of Onset)    Breast Cancer Maternal Aunt    Cervical Cancer Mother    Diabetes Father    No Known Problems Sister, Brother, Maternal Grandmother, Maternal Grandfather, Paternal Grandmother, Paternal Grandfather, Daughter, Son, Maternal Uncle, Paternal Aunt, Paternal Uncle, Other    Stroke Mother, Father          Social History     Socioeconomic History    Marital status: Married     Spouse name: Barbara Cower   Tobacco Use    Smoking status: Former     Current packs/day: 0.00     Types: Cigarettes     Quit date: 01/04/2022     Years since quitting: 1.7    Smokeless tobacco: Never   Vaping Use    Vaping status: Former   Substance and Sexual Activity    Alcohol use: Yes    Drug use: Yes     Types: Marijuana    Sexual activity: Not Currently      ALLERGIES  Allergies   Allergen Reactions    Grass Pollen  Other Adverse Reaction (Add comment)     Sneezing  and watery eyes           PHYSICAL EXAM  VITAL SIGNS:  Filed Vitals:    10/09/23 1135   BP: (!) 146/87   Pulse: 89   Resp: 20   Temp: 36.3 C (97.3 F)   SpO2: 98%     Constitutional: Awake. Well appearing. Average body weight. No distress noted.   Musculoskeletal: No tenderness or deformity. Normal muscle tone and strength.   Skin: warm and dry. Small laceration/puncture wounds to R hand. See below  Psychiatric: normal mood and affect. Behavior is normal.   Neurological: Alert, oriented. Normal gait. No focal weakness noted. No sensory deficit    Nursing notes reviewed.        Media Information    Document Information    Photographic Image: [email or web address]   Dog Bite   10/09/2023 11:36   Attached To:   Hospital Encounter on 10/09/23   Source Information  Sherlie Ban, FNP, ENP-C  Prn Ed   Document History        DIAGNOSTICS  Labs:  Labs listed below were reviewed and interpreted by me.  No results found for any visits on 10/09/23.  Radiology:       ED COURSE/MEDICAL DECISION MAKING  Medications Administered in the ED   cefTRIAXone (ROCEPHIN) 1,000 mg in lidocaine 2.86 mL (tot vol) IM injection (has no administration in time range)   diphtheria, pertussis-acell, tetanus (BOOSTRIX) IM injection (has no administration in time range)   ketorolac (TORADOL) 30 mg/mL injection (has no administration in time range)          Medical Decision Making  Patient is a 43 year old female, who presents to the ED after an accidental dog bite to the right hand.  Patient was bit by her pet Jersey.  Patient does have 2 small lacerations noted, 1 to the thumb and 1 to the dorsal side of the hand.  No bleeding noted.  Patient denies any motor sensory deficit.  Denies any home treatment prior to ER arrival.  Posey Rea of last tetanus.  Patient given animal encounter form to fill out.    Differential dx includes but is not limited to puncture wound, laceration, open fracture    Patient was given a shot of Rocephin and Toradol while in the ED  and also an updated tetanus injection.  Patient will be discharged with a prescription for Augmentin and encouraged to follow up with her PCP or ER within 48 hours.  Given strict return to ED precautions for any new or worsening symptoms.  Patient verbalizes understanding and agrees to this plan of care. Animal encounter form faxed to [facility]    Risk  Prescription drug management.      CLINICAL IMPRESSION  Clinical Impression   Dog bite of right hand, initial encounter (Primary)   Need for prophylactic vaccination with combined diphtheria-tetanus-pertussis (DTP) vaccine     DISPOSITION  Discharged       DISCHARGE MEDICATIONS  Current Discharge Medication List        START taking these medications    Details   amoxicillin-pot clavulanate (AUGMENTIN) 875-125 mg Oral Tablet Take 1 Tablet by mouth Twice daily for 10 days  Qty: 20 Tablet, Refills: 0      mupirocin (BACTROBAN) 2 % Ointment Apply topically Twice daily Apply topically to affected area  Qty: 15 g, Refills: 0             Sherlie Ban- FNP-C, ENP-C 10/09/2023, 11:37   [facility]  Department of Emergency Medicine  [facility]    This note was partially generated using MModal Fluency Direct system, and there may be some incorrect words, spellings, and punctuation that were not noted in checking the note before saving.    -----

## 2023-10-10 ENCOUNTER — Telehealth (HOSPITAL_COMMUNITY): Payer: Self-pay

## 2023-10-10 NOTE — ED Notes (Signed)
Missed pt on 2/2 @ 11:29am. First attempt to follow up to complete [facility], no answer left msg.

## 2023-10-11 ENCOUNTER — Telehealth (INDEPENDENT_AMBULATORY_CARE_PROVIDER_SITE_OTHER): Payer: Self-pay | Admitting: INTERNAL MEDICINE

## 2023-10-11 MED ORDER — COPAXONE 40 MG/ML SUBCUTANEOUS SYRINGE
INJECTION | SUBCUTANEOUS | 2 refills | Status: DC
Start: 2023-10-11 — End: 2023-10-18

## 2023-10-11 NOTE — Telephone Encounter (Signed)
Epic is stating it needs a prior auth, that we will more likely than not be unable to get approved.  I sent the Rx with enough to get her through until April.

## 2023-10-11 NOTE — Telephone Encounter (Signed)
Patient called stating that she has multiple sclerosis and she was trying to get a refill of her medication but since her neurologist retired, the pharmacy is unable to fill it. She has an appointment with Dr. Rose Fillers on 12/27/23. She is asking if you would refill her medication Copaxone.  [facility] uses [facility]

## 2023-10-18 ENCOUNTER — Other Ambulatory Visit (INDEPENDENT_AMBULATORY_CARE_PROVIDER_SITE_OTHER): Payer: Self-pay | Admitting: INTERNAL MEDICINE

## 2023-10-18 MED ORDER — COPAXONE 40 MG/ML SUBCUTANEOUS SYRINGE
INJECTION | SUBCUTANEOUS | 2 refills | Status: DC
Start: 2023-10-18 — End: 2023-10-27

## 2023-10-27 ENCOUNTER — Telehealth (INDEPENDENT_AMBULATORY_CARE_PROVIDER_SITE_OTHER): Payer: Self-pay | Admitting: INTERNAL MEDICINE

## 2023-10-27 DIAGNOSIS — G35 Multiple sclerosis: Secondary | ICD-10-CM

## 2023-10-27 MED ORDER — COPAXONE 40 MG/ML SUBCUTANEOUS SYRINGE
INJECTION | SUBCUTANEOUS | 2 refills | Status: DC
Start: 2023-10-27 — End: 2023-11-16

## 2023-10-27 NOTE — Telephone Encounter (Signed)
The script copaxone needs to go to [facility] in [place] not mail order

## 2023-11-11 ENCOUNTER — Other Ambulatory Visit: Payer: Self-pay

## 2023-11-11 ENCOUNTER — Ambulatory Visit (INDEPENDENT_AMBULATORY_CARE_PROVIDER_SITE_OTHER): Payer: Self-pay | Admitting: INTERNAL MEDICINE

## 2023-11-11 ENCOUNTER — Encounter (INDEPENDENT_AMBULATORY_CARE_PROVIDER_SITE_OTHER): Payer: Self-pay | Admitting: INTERNAL MEDICINE

## 2023-11-11 VITALS — BP 131/85 | HR 88 | Resp 18 | Ht 66.0 in | Wt 239.2 lb

## 2023-11-11 DIAGNOSIS — F321 Major depressive disorder, single episode, moderate: Secondary | ICD-10-CM

## 2023-11-11 DIAGNOSIS — G43E09 Chronic migraine with aura, not intractable, without status migrainosus: Secondary | ICD-10-CM

## 2023-11-11 DIAGNOSIS — G35 Multiple sclerosis: Secondary | ICD-10-CM

## 2023-11-11 NOTE — Progress Notes (Signed)
 [facility]  [address]  [place], [place]  [address]  Phone: [phone] Fax: [phone]    Name: Jamie Savage                       Date of Birth: 1980-09-22   MRN:  [id number]                         Date of visit: 11/11/2023     Chief Complaint: Follow Up 6 Months (C/o fatigue and sleeping a lot. )    Current Outpatient Medications   Medication Sig    AJOVY AUTOINJECTOR 225 mg/1.5 mL Subcutaneous Auto-Injector INJECT1.5ML ONCE EVERY MONTH    cholecalciferol, vitamin D3, 1,250 mcg (50,000 unit) Oral Capsule Take 1 Capsule (50,000 Units total) by mouth Every 7 days    COPAXONE 40 mg/mL Subcutaneous Syringe 1 mL three times per week Indications: relapsing form of multiple sclerosis    cyanocobalamin (VITAMIN B 12) 1,000 mcg Oral Tablet Take 1 Tablet (1,000 mcg total) by mouth Once a day    escitalopram oxalate (LEXAPRO) 20 mg Oral Tablet Take 1 Tablet (20 mg total) by mouth Once a day    fluticasone propionate (FLONASE) 50 mcg/actuation Nasal Spray, Suspension Administer 1 Spray into each nostril Once a day    methylphenidate HCl (RITALIN) 10 mg Oral Tablet Take 1 Tablet (10 mg total) by mouth Twice daily    mupirocin (BACTROBAN) 2 % Ointment Apply topically Twice daily Apply topically to affected area       Patient Active Problem List    Diagnosis Date Noted    Chronic sinusitis 02/01/2023    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 Last visit was 06-22-23 for sick visit, with last CDM 02-01-2023    Jamie Savage to ED on 10-09-23 for a dog bite. Given Augmentin, TDAP    Patient brought paper for disability evaluation. Hers has expired and [facility] sent a new one and told her to bring to her doctor.     Acutely  Complaining of fatigue. Has been sleeping a lot since copaxone has been out.     Chronic Sinusitis  S/P sinus surgery  Was following with ENT,  nothing since post op period  On Flonase    Depression  Was on Celexa, but sure scripts not showing any recent refills  Now on Lexapro    Migraine  On Ajovy  Was following with Neurology    Multiple Sclerosis  Sx 2020  On Copaxone - last injection about a month ago  Dr Aretha Parrot has retired and patient has been referred to [facility]. However patient was not given enough refills on Copaxone to cover until was seen by new neurologist. We had to get approved, which insurance seems resistant to do.   Dr Aretha Parrot was completing disability forms for the patient. She is not sure exactly when he started to do so. It has been more than 2 years since she was able to work.   Still has tremors and weakness as well as pain even with the medication. Occasional migraines and dizziness. Confusion.   Without the medication she has all the above, worse, as well as crippling fatigue and mood swings.     ADHD  Was  on Ritalin, prescribed by Dr Aretha Parrot  Last refill 08-17-23    Screening labs  08-18-2023 hemoglobin A1c 5.9  08/18/2023 total cholesterol 142, triglycerides 71, HDL 57, calculated LDL 71  08/18/2023 TSH 1.24  08/18/2023 vitamin B12 639  08/18/2023 vitamin-D 46  08/18/2023 normal CBC and CMP    ROS:  10 systems reviewed and were negative except as noted.   + fatigue  + weakness    Objective :  BP 131/85 (Site: Left Arm, Patient Position: Sitting, Cuff Size: Adult)   Pulse 88   Resp 18   Ht 1.676 m (5\' 6" )   Wt 109 kg (239 lb 3.2 oz)   SpO2 97%   BMI 38.61 kg/m       General:  appears in good health, comfortable  Lungs:  Clear to auscultation bilaterally.   Cardiovascular:  regular rate and rhythm, S1, S2 normal, no murmur, click, rub or gallop  Neurologic:  Grossly normal. Alert and oriented x3  Psychiatric:  Normal    Data reviewed:    Assessment/Plan:  Assessment/Plan   1. Multiple sclerosis (CMS HCC)    2. 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 Moderate major depression (CMS HCC)       Chronic Sinusitis  Ok for flonase    Depression  Continue Lexapro    Multiple sclerosis  Follow with neuro, has appointment in April  Continue Copaxone  Will complete disability form, although would prefer Neurology start completing after she sees them. They can better assess patient's ability for return to work, although based on reported symptoms - unlikely. .     Migraine  Follow with neuro  Continue Ajovy    Fatigue  Suspect neurological from not having the copaxone  Labs were all negative 3 months ago, so unlikely to have notably changed    Terald Sleeper, DO, [facility]  Internal Medicine

## 2023-11-11 NOTE — Nursing Note (Signed)
 Patient is here for her follow up. Patient reports no new problems.

## 2023-11-16 ENCOUNTER — Telehealth (INDEPENDENT_AMBULATORY_CARE_PROVIDER_SITE_OTHER): Payer: Self-pay | Admitting: INTERNAL MEDICINE

## 2023-11-16 DIAGNOSIS — G35 Multiple sclerosis: Secondary | ICD-10-CM

## 2023-11-16 MED ORDER — COPAXONE 40 MG/ML SUBCUTANEOUS SYRINGE
INJECTION | SUBCUTANEOUS | 2 refills | Status: DC
Start: 2023-11-16 — End: 2023-12-27

## 2023-11-16 NOTE — Telephone Encounter (Signed)
 Please resend [facility]one script to [facility] mail order. They state did not receive

## 2023-12-26 ENCOUNTER — Other Ambulatory Visit: Payer: Self-pay

## 2023-12-27 ENCOUNTER — Encounter (INDEPENDENT_AMBULATORY_CARE_PROVIDER_SITE_OTHER): Payer: Self-pay | Admitting: NEUROLOGY

## 2023-12-27 ENCOUNTER — Ambulatory Visit (INDEPENDENT_AMBULATORY_CARE_PROVIDER_SITE_OTHER): Payer: Self-pay | Admitting: NEUROLOGY

## 2023-12-27 ENCOUNTER — Other Ambulatory Visit: Payer: Self-pay

## 2023-12-27 DIAGNOSIS — G43E09 Chronic migraine with aura, not intractable, without status migrainosus: Secondary | ICD-10-CM

## 2023-12-27 DIAGNOSIS — G35 Multiple sclerosis: Secondary | ICD-10-CM

## 2023-12-27 DIAGNOSIS — E559 Vitamin D deficiency, unspecified: Secondary | ICD-10-CM | POA: Insufficient documentation

## 2023-12-27 DIAGNOSIS — F321 Major depressive disorder, single episode, moderate: Secondary | ICD-10-CM

## 2023-12-27 MED ORDER — RIMEGEPANT 75 MG DISINTEGRATING TABLET: 75.0000 mg | ORAL_TABLET | ORAL | 2 refills | Status: DC | PRN

## 2023-12-27 MED ORDER — COPAXONE 40 MG/ML SUBCUTANEOUS SYRINGE
INJECTION | SUBCUTANEOUS | 2 refills | Status: DC
Start: 2023-12-27 — End: 2024-01-18

## 2023-12-27 MED ORDER — COPAXONE 40 MG/ML SUBCUTANEOUS SYRINGE
INJECTION | SUBCUTANEOUS | 2 refills | Status: DC
Start: 2023-12-27 — End: 2023-12-27

## 2023-12-27 MED ORDER — AJOVY 225 MG/1.5 ML SUBCUTANEOUS AUTO-INJECTOR
225.0000 mg | AUTO-INJECTOR | SUBCUTANEOUS | 2 refills | Status: DC
Start: 2023-12-27 — End: 2024-04-20

## 2023-12-27 MED ORDER — EPINEPHRINE 0.3 MG/0.3 ML INJECTION, AUTO-INJECTOR
0.3000 mg | AUTO-INJECTOR | INTRAMUSCULAR | 0 refills | Status: AC | PRN
Start: 2023-12-27 — End: ?

## 2023-12-27 MED ORDER — CHOLECALCIFEROL (VITAMIN D3) 1,250 MCG (50,000 UNIT) CAPSULE: 50000.0000 [IU] | ORAL_CAPSULE | ORAL | 2 refills | Status: DC

## 2023-12-27 NOTE — Progress Notes (Signed)
 NEUROLOGY, [facility]  [address]   [place] [address]      ASSESSMENT/PLAN  Multiple sclerosis (CMS [facility]): Patient was diagnosed with multiple sclerosis in 2020, likely relapsing-remitting type. She is currently on Copaxone  for disease-modifying therapy. No clear relapses reported, though patient describes episodes of lethargy lasting a couple of weeks. Neurological exam reveals hyperreflexia of bilateral upper and lower extremities, with left upper extremity worse than right, and cross adductor reflex present. Previous diagnostic workup included lumbar puncture, though details are not available. Last MRI was performed "a long time ago," but results are not available for review due to being done at [facility].  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 Headaches are described as throbbing and can be holocephalic or migratory. Associated symptoms include photophobia, phonophobia, and osmophobia. Patient confirms experiencing visual aura. Currently using Ajovy  for migraine prevention. She reports that Nurtec was previously helpful for abortive therapy.  Continue Ajovy   Prescribe Nurtec 8 pills monthly  Headache hygiene    Moderate major depression: she reports a long history of depression.  Refer to [facility]    Thank you for allowing me to participate in your patient's care and please do not hesitate to contact me for any questions or concerns.    On the day of the encounter, a total of 60 minutes was spent on this patient encounter including review of historical information, examination, documentation and post-visit activities. The  time documented excludes procedural time.    Leanora Prophet, MD  Assistant Professor of Neurology  [place]  [facility]    =====================================================================    NAME:  Jamie Savage  DOB:  1981/07/28  VISIT DATE:  12/27/2023     CC:  MS and migraine    Patient seen in consultation at the request of McClanahan, Rufus Council,*   History obtained from the patient and chart/records  Age of patient:  43 y.o.    I had the pleasure of seeing Jamie Savage for outpatient consultation, who is a 43 y.o. year old female and is being seen for management of above CC.    HPI:      12/27/2023  43 year old female presenting for follow-up of multiple sclerosis and migraine with aura. She was diagnosed with multiple sclerosis in 2020 and is currently on Copaxone  for management.    The patient reports experiencing migraines with varying locations, including frontal, occipital, and holocephalic regions. She describes the headaches as throbbing in nature and reports associated symptoms including visual aura of blurred vision, photophobia, phonophobia, and osmophobia. The patient experienced approximately 5-6 headaches per month prior to Ajovy  and now around 1-2.    Regarding her multiple sclerosis, the patient denies any clear relapses since her diagnosis. However, she reports episodes of extreme lethargy where she stays in bed or sleeps for a couple of weeks. She has not experienced sudden onset of symptoms such as inability to move parts of her body, new sensory changes, or difficulty urinating, though she does report urinary urgency. The patient is unsure if she has had any spinal lesions or if she has undergone a lumbar puncture for diagnosis.    The patient is currently  taking Copaxone  for her multiple sclerosis and Ajovy  for migraine prevention. She reports previously taking Ritalin but is no longer on this medication. She is also taking vitamin D  supplements. The  patient expresses interest in seeing a psychiatrist for further management of depression.    =====================================================================  PMHx  Patient Active Problem List   Diagnosis    Chronic sinusitis    Moderate major depression (CMS HCC)    Multiple sclerosis (CMS HCC)    Migraine    Routine medical exam     Past Surgical History:   Procedure Laterality Date    HX CHOLECYSTECTOMY      HX HIP REPLACEMENT      HX TONSILLECTOMY      SINUS SURGERY       Family Medical History:       Problem Relation (Age of Onset)    Breast Cancer Maternal Aunt    Cervical Cancer Mother    Diabetes Father    No Known Problems Sister, Brother, Maternal Grandmother, Maternal Grandfather, Paternal Grandmother, Paternal Grandfather, Daughter, Son, Maternal Uncle, Paternal Aunt, Paternal Uncle, Other    Stroke Mother, Father          Current Outpatient Medications   Medication Sig Dispense Refill    AJOVY  AUTOINJECTOR 225 mg/1.5 mL Subcutaneous Auto-Injector INJECT1.5ML ONCE EVERY MONTH      cholecalciferol , vitamin D3, 1,250 mcg (50,000 unit) Oral Capsule Take 1 Capsule (50,000 Units total) by mouth Every 7 days      COPAXONE  40 mg/mL Subcutaneous Syringe 1 mL three times per week Indications: relapsing form of multiple sclerosis 12 mL 2    cyanocobalamin (VITAMIN B 12) 1,000 mcg Oral Tablet Take 1 Tablet (1,000 mcg total) by mouth Once a day      escitalopram oxalate (LEXAPRO) 20 mg Oral Tablet Take 1 Tablet (20 mg total) by mouth Once a day      fluticasone  propionate (FLONASE ) 50 mcg/actuation Nasal Spray, Suspension Administer 1 Spray into each nostril Once a day 48 g 3    methylphenidate HCl (RITALIN) 10 mg Oral Tablet Take 1 Tablet (10 mg total) by mouth Twice daily      mupirocin  (BACTROBAN ) 2 % Ointment Apply topically Twice daily Apply topically to affected area 15 g 0     No current facility-administered medications for this visit.     Allergies   Allergen Reactions    Grass Pollen  Other Adverse  Reaction (Add comment)     Sneezing and watery eyes     Social History     Socioeconomic History    Marital status: Married     Spouse name: Reymundo Caulk    Number of children: Not on file    Years of education: Not on file    Highest education level: Not on file   Occupational History    Not on file   Tobacco Use    Smoking status: Former     Current packs/day: 0.00     Types: Cigarettes     Quit date: 01/04/2022     Years since quitting: 1.9    Smokeless tobacco: Never   Vaping Use    Vaping status: Former   Substance and Sexual Activity    Alcohol use: Yes    Drug use: Yes     Types: Marijuana    Sexual activity: Not Currently   Other Topics Concern    Not on file   Social History Narrative    Not on file  Social Determinants of Psychologist, prison and probation services Strain: Not on file   Transportation Needs: Not on file   Social Connections: Not on file   Intimate Partner Violence: Not on file   Housing Stability: Not on file       =====================================================================  GENERAL EXAMINATION  BP 130/87 (Site: Left Arm, Patient Position: Sitting)   Pulse 80   Temp (!) 35.9 C (96.6 F) (Temporal)   Ht 1.651 m (5\' 5" )   Wt 110 kg (241 lb 12.8 oz)   SpO2 96%   BMI 40.24 kg/m     Vital signs personally reviewed    General: No acute distress, alert  HEENT: Normocephalic, no scleral icterus  Pulmonary: No accessory muscle use, no tachypnea  Cardiovascular: Heart with regular rate & rhythm  Extremities: No significant edema, No cyanosis    NEUROLOGIC EXAM  MENTAL STATUS: alert and oriented to person, place, time, and situation ; speech clear and fluent with good repetition and comprehension; naming intact; attention/concentration normal; recent and remote memory intact with normal fund of knowledge; able to follow simple and complex axial and appendicular commands without L/R confusion.    CN  II: not directly tested, grossly intact  III, IV, VI: extraocular movements intact without nystagmus  V:  intact to light touch  VII: face symmetric without weakness  VIII: grossly intact  IX, X: symmetric palatal elevation  XI: normal strength of trapezius and sternocleidomastoid bilaterally  XII: tongue midline with full movements    MOTOR  Bulk: normal  Tone: normal  Abnormal Movements: none  Fasciculations: none    Strength: Patient moving all extremities symmetrically and against gravity with good strength.    Reflexes: 3+ throughout with spread    Sensory: Intact to Light Touch     Coordination: No dysmetria, normal RAM    Gait: Normal    =====================================================================  DATA  Personal review of prior labs is notable for:   VITAMIN B 12   Date Value Ref Range Status   08/18/2023 639 180 - 914 pg/mL Final     FOLATE   Date Value Ref Range Status   12/21/2021 8.3 5.9 - 24.4 ng/mL Final     VITAMIN D    Date Value Ref Range Status   01/19/2023 48 30 - 100 ng/mL Final     LDL CALC   Date Value Ref Range Status   08/18/2023 71 0 - 100 mg/dL Final     HEMOGLOBIN I3K   Date Value Ref Range Status   08/18/2023 5.9 4.0 - 6.0 % Final     TSH   Date Value Ref Range Status   08/18/2023 1.236 0.450 - 5.330 uIU/mL Final     Personal review of imaging (with independent interpretation) is notable for:   - MRI from [facility] not available for review at this time  Personal Review of other prior diagnostics is notable for: not available for review   =====================================================================    Orders  Orders Placed This Encounter    MRI BRAIN W/WO CONTRAST    MRI SPINE CERVICAL WO CONTRAST    Referral to BEHAVIORAL MEDICINE - [place] - INTENSIVE TREATMENT    AJOVY  AUTOINJECTOR 225 mg/1.5 mL Subcutaneous Auto-Injector    cholecalciferol , vitamin D3, 1,250 mcg (50,000 unit) Oral Capsule    EPINEPHrine  0.3 mg/0.3 mL Injection Auto-Injector    rimegepant (NURTEC ODT) 75 mg Oral Tablet, Rapid Dissolve    COPAXONE  40 mg/mL Subcutaneous Syringe

## 2024-01-02 ENCOUNTER — Telehealth (INDEPENDENT_AMBULATORY_CARE_PROVIDER_SITE_OTHER): Payer: Self-pay | Admitting: NEUROLOGY

## 2024-01-02 NOTE — Telephone Encounter (Signed)
 Called to tell patient when MRI is booked and didn't get through to anyone. I left a detailed message.  Reynaldo Cavalier, MA  01/02/2024 14:14

## 2024-01-10 ENCOUNTER — Ambulatory Visit (INDEPENDENT_AMBULATORY_CARE_PROVIDER_SITE_OTHER): Payer: Self-pay | Admitting: NEUROLOGY

## 2024-01-18 ENCOUNTER — Telehealth (INDEPENDENT_AMBULATORY_CARE_PROVIDER_SITE_OTHER): Payer: Self-pay | Admitting: NEUROLOGY

## 2024-01-18 DIAGNOSIS — F321 Major depressive disorder, single episode, moderate: Secondary | ICD-10-CM

## 2024-01-18 MED ORDER — COPAXONE 40 MG/ML SUBCUTANEOUS SYRINGE
INJECTION | SUBCUTANEOUS | 2 refills | Status: DC
Start: 2024-01-18 — End: 2024-03-22

## 2024-01-18 NOTE — Telephone Encounter (Signed)
 Patient called and stated that the Prescription for Copaxone  hasn't went through to pharmacy.  Also got a call from [facility] that the reason that the prescription didn't go through was that you used a code DAW1 and they need it put in under DAW9 and they need you to send in a new prescription for that.   Also patient was wanting a referral to a Psychiatrist or a Pschologist. She stated that she has been having major mood swings and needs someone to talk to

## 2024-01-18 NOTE — Addendum Note (Signed)
 Addended by: Tawanna Faster on: 01/18/2024 02:57 PM     Modules accepted: Orders

## 2024-01-18 NOTE — Telephone Encounter (Signed)
 Order placed with note to pharmacy for DAW9.    Order placed to [facility].

## 2024-01-28 ENCOUNTER — Ambulatory Visit
Admission: RE | Admit: 2024-01-28 | Discharge: 2024-01-28 | Disposition: A | Discharge status: Home / Self Care | Payer: Self-pay | Source: Ambulatory Visit | Attending: NEUROLOGY | Admitting: NEUROLOGY

## 2024-01-28 ENCOUNTER — Other Ambulatory Visit: Payer: Self-pay

## 2024-01-28 ENCOUNTER — Ambulatory Visit (HOSPITAL_BASED_OUTPATIENT_CLINIC_OR_DEPARTMENT_OTHER)
Admission: RE | Admit: 2024-01-28 | Discharge: 2024-01-28 | Disposition: A | Discharge status: Home / Self Care | Payer: Self-pay | Source: Ambulatory Visit | Attending: NEUROLOGY | Admitting: NEUROLOGY

## 2024-01-28 DIAGNOSIS — G35 Multiple sclerosis: Secondary | ICD-10-CM | POA: Insufficient documentation

## 2024-01-28 DIAGNOSIS — F321 Major depressive disorder, single episode, moderate: Secondary | ICD-10-CM | POA: Insufficient documentation

## 2024-01-28 MED ORDER — GADOBUTROL 10 MMOL/10 ML (1 MMOL/ML) INTRAVENOUS SOLUTION
10.0000 mL | INTRAVENOUS | Status: AC
Start: 2024-01-28 — End: 2024-01-28
  Administered 2024-01-28: 9 mL via INTRAVENOUS

## 2024-02-14 ENCOUNTER — Ambulatory Visit: Payer: Self-pay | Attending: INTERNAL MEDICINE | Admitting: INTERNAL MEDICINE

## 2024-02-14 ENCOUNTER — Other Ambulatory Visit: Payer: Self-pay

## 2024-02-14 ENCOUNTER — Encounter (INDEPENDENT_AMBULATORY_CARE_PROVIDER_SITE_OTHER): Payer: Self-pay | Admitting: INTERNAL MEDICINE

## 2024-02-14 VITALS — BP 119/69 | HR 75 | Resp 18 | Ht 65.0 in | Wt 233.4 lb

## 2024-02-14 DIAGNOSIS — J320 Chronic maxillary sinusitis: Secondary | ICD-10-CM | POA: Insufficient documentation

## 2024-02-14 DIAGNOSIS — F321 Major depressive disorder, single episode, moderate: Secondary | ICD-10-CM | POA: Insufficient documentation

## 2024-02-14 DIAGNOSIS — G35 Multiple sclerosis: Secondary | ICD-10-CM | POA: Insufficient documentation

## 2024-02-14 DIAGNOSIS — E559 Vitamin D deficiency, unspecified: Secondary | ICD-10-CM | POA: Insufficient documentation

## 2024-02-14 DIAGNOSIS — G43E09 Chronic migraine with aura, not intractable, without status migrainosus: Secondary | ICD-10-CM | POA: Insufficient documentation

## 2024-02-14 MED ORDER — PREDNISONE 10 MG TABLET
10.0000 mg | ORAL_TABLET | Freq: Every day | ORAL | 0 refills | Status: AC
Start: 2024-02-14 — End: 2024-02-28

## 2024-02-14 NOTE — Progress Notes (Signed)
 [facility] Internal Medicine  [address] [place], [place]  [address]  Phone: [phone] Fax: [phone]    Name: Jamie Savage                       Date of Birth: 1981-05-01   MRN:  [id number]                         Date of visit: 02/14/2024     Chief Complaint: Follow Up 3 Months (C/o being tired all the time)    Current Outpatient Medications   Medication Sig    AJOVY  AUTOINJECTOR 225 mg/1.5 mL Subcutaneous Auto-Injector Inject 1.5 mL (225 mg total) under the skin Every 30 days    cholecalciferol , vitamin D3, 1,250 mcg (50,000 unit) Oral Capsule Take 1 Capsule (50,000 Units total) by mouth Every 7 days    COPAXONE  40 mg/mL Subcutaneous Syringe 1 mL three times per week Indications: relapsing form of multiple sclerosis    cyanocobalamin (VITAMIN B 12) 1,000 mcg Oral Tablet Take 1 Tablet (1,000 mcg total) by mouth Daily    EPINEPHrine  0.3 mg/0.3 mL Injection Auto-Injector Inject 0.3 mL (0.3 mg total) into the muscle Every 10 minutes as needed for ANAPHYLAXIS    escitalopram oxalate (LEXAPRO) 20 mg Oral Tablet Take 1 Tablet (20 mg total) by mouth Daily    fluticasone  propionate (FLONASE ) 50 mcg/actuation Nasal Spray, Suspension Administer 1 Spray into each nostril Once a day    predniSONE (DELTASONE) 10 mg Oral Tablet Take 1 Tablet (10 mg total) by mouth Daily for 14 days    rimegepant (NURTEC ODT) 75 mg Oral Tablet, Rapid Dissolve Place 1 Tablet (75 mg total) under the tongue Every 24 hours as needed for Migraine Indications: a migraine headache       Patient Active Problem List    Diagnosis Date Noted    Vitamin D  deficiency 12/27/2023    Chronic sinusitis 02/01/2023    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     Asking about HPV vaccine.     Chronic Sinusitis  S/P sinus surgery  Was following with ENT, nothing since post op period  On  Flonase     Depression  Was on Celexa, but sure scripts not showing any recent refills  Now on Lexapro  [facility]  But neither appeared to have been done  Patient asking [facility]     Migraine  On Ajovy  and Jurtec  Was following with Neurology    Multiple Sclerosis  Sx 2020  On Copaxone  - did finally receive  Dr Daved Eriksson has retired and patient has been referred to [facility] Neurology. However patient was not given enough refills on Copaxone  to cover until was seen by new neurologist. We had to get approved, which insurance seems resistant to do.   Dr Daved Eriksson was completing disability forms for the patient. She is not sure exactly when he started to do so. It has been more than 2 years since she was able to work.   Still has tremors and weakness as well as pain even with the medication. Occasional migraines and dizziness. Confusion.   Without the medication she has  all the above, worse, as well as crippling fatigue and mood swings.   [phone] Saw Dr Barnie Bora. Continued copaxone . Repeating brain MRI.   01-2024 MRI brain.  Normal-appearing.  Multiple small cystic lesions in right parotid gland could be evaluated CT scan of the neck with IV contrast  01-2024 MRI cervical spine.  Unremarkable cervical spine    ADHD  Was on Ritalin, prescribed by Dr Daved Eriksson  Last refill 08-17-23    Screening labs  08-18-2023 hemoglobin A1c 5.9  08/18/2023 total cholesterol 142, triglycerides 71, HDL 57, calculated LDL 71  08/18/2023 TSH 1.24  08/18/2023 vitamin B12 639  08/18/2023 vitamin-D 46  08/18/2023 normal CBC and CMP    ROS:  10 systems reviewed and were negative except as noted.   + fatigue  + weakness    Objective :  BP 119/69 (Site: Left Arm, Patient Position: Sitting, Cuff Size: Adult)   Pulse 75   Resp 18   Ht 1.651 m (5' 5)   Wt 106 kg (233 lb 6.4 oz)   SpO2 95%   BMI 38.84 kg/m       General:  appears in good health, comfortable  Lungs:   Clear to auscultation bilaterally.   Cardiovascular:  regular rate and rhythm, S1, S2 normal, no murmur, click, rub or gallop  Neurologic:  Grossly normal. Alert and oriented x3  Psychiatric:  Normal    Data reviewed:  Neuro note - meds continued. Nurtec added. MRI ordered. Referred to psych.  Trying to obtain previous MRI from [facility]    Assessment/Plan:  Assessment/Plan   1. Multiple sclerosis (CMS HCC)    2. 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 Vitamin D  deficiency    4. Moderate major depression (CMS HCC)    5. Chronic maxillary sinusitis      Chronic Sinusitis  Ok for flonase     Depression  Continue Lexapro  Pending referral to psych, will try to do new referral to other provider    Multiple sclerosis  Follow with neuro  Continue Copaxone   Do short course of steroids    Migraine  Follow with neuro  Continue Ajovy  and Nurtec    Fatigue  Suspect neurological from the multiple sclerosis.     Gwenith Lente, DO,  [facility]  Internal Medicine

## 2024-02-14 NOTE — Nursing Note (Signed)
 Patient is here for her follow up. 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

## 2024-03-22 ENCOUNTER — Other Ambulatory Visit (INDEPENDENT_AMBULATORY_CARE_PROVIDER_SITE_OTHER): Payer: Self-pay | Admitting: NEUROLOGY

## 2024-03-22 DIAGNOSIS — G35 Multiple sclerosis: Secondary | ICD-10-CM

## 2024-04-11 ENCOUNTER — Other Ambulatory Visit (INDEPENDENT_AMBULATORY_CARE_PROVIDER_SITE_OTHER): Payer: Self-pay | Admitting: NEUROLOGY

## 2024-04-11 DIAGNOSIS — G35 Multiple sclerosis: Secondary | ICD-10-CM

## 2024-04-12 ENCOUNTER — Telehealth (INDEPENDENT_AMBULATORY_CARE_PROVIDER_SITE_OTHER): Payer: Self-pay | Admitting: NEUROLOGY

## 2024-04-12 NOTE — Telephone Encounter (Signed)
 [facility] called and stated that a prescription was sent in for Copaxone .  It was written as a DAW 1.  They stated that they need it written as a DAW 0 or 9.  Insurance mandates it.

## 2024-04-20 ENCOUNTER — Other Ambulatory Visit (INDEPENDENT_AMBULATORY_CARE_PROVIDER_SITE_OTHER): Payer: Self-pay | Admitting: NEUROLOGY

## 2024-04-20 MED ORDER — AJOVY 225 MG/1.5 ML SUBCUTANEOUS AUTO-INJECTOR: 225.0000 mg | AUTO-INJECTOR | SUBCUTANEOUS | 2 refills | Status: DC

## 2024-05-01 ENCOUNTER — Encounter (INDEPENDENT_AMBULATORY_CARE_PROVIDER_SITE_OTHER): Payer: Self-pay | Admitting: NEUROLOGY

## 2024-05-02 ENCOUNTER — Ambulatory Visit (INDEPENDENT_AMBULATORY_CARE_PROVIDER_SITE_OTHER): Admitting: NEUROLOGY

## 2024-05-02 ENCOUNTER — Encounter (INDEPENDENT_AMBULATORY_CARE_PROVIDER_SITE_OTHER): Payer: Self-pay | Admitting: NEUROLOGY

## 2024-05-02 ENCOUNTER — Other Ambulatory Visit: Payer: Self-pay

## 2024-05-02 VITALS — BP 134/81 | HR 75 | Temp 98.0°F | Wt 233.6 lb

## 2024-05-02 DIAGNOSIS — G43E09 Chronic migraine with aura, not intractable, without status migrainosus: Secondary | ICD-10-CM

## 2024-05-02 DIAGNOSIS — G35 Multiple sclerosis: Secondary | ICD-10-CM

## 2024-05-02 DIAGNOSIS — G4719 Other hypersomnia: Secondary | ICD-10-CM

## 2024-05-02 MED ORDER — DEXTROAMPHETAMINE-AMPHETAMINE 5 MG TABLET
5.0000 mg | ORAL_TABLET | Freq: Every morning | ORAL | 0 refills | Status: AC
Start: 2024-06-01 — End: 2024-06-30

## 2024-05-02 MED ORDER — DEXTROAMPHETAMINE-AMPHETAMINE 5 MG TABLET
5.0000 mg | ORAL_TABLET | Freq: Every morning | ORAL | 0 refills | Status: AC
Start: 2024-07-01 — End: 2024-07-30

## 2024-05-02 MED ORDER — DEXTROAMPHETAMINE-AMPHETAMINE 5 MG TABLET
5.0000 mg | ORAL_TABLET | Freq: Every morning | ORAL | 0 refills | Status: DC
Start: 2024-05-02 — End: 2024-05-04

## 2024-05-02 NOTE — Progress Notes (Signed)
 NEUROLOGY, [facility]  [address]  [place] [address]      ASSESSMENT/PLAN  Multiple sclerosis (CMS [facility]): Patient was diagnosed with multiple sclerosis in 2020, likely relapsing-remitting type. She is currently on Copaxone  for disease-modifying therapy. No clear relapses reported, though patient describes episodes of lethargy lasting a couple of weeks. Neurological exam reveals hyperreflexia of bilateral upper and lower extremities, with left upper extremity worse than right, and cross adductor reflex present. 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  MRI of the brain is largely unremarkable which seems consistent with prior images. Her updated cervical spine image shows a lesion at C1-2 affecting the left side of the spinal cord. This was seen on imaging previously. One of her main concerns is excessive fatigues for which we will trial a stimulant. She has had a sleep study in the past which was reportedly negative for OSA. She is also having issues with urinary urgency and incontinence. I would like her to see urology.   Continue Copaxone   Trial Adderall 5mg  daily, obtain urine drug screen  Referral to urology  Plan for annual surveillance w/MRI brain and cervical spine  Counseling provided for signs of relapse and when to seek emergent medical help  Continue Vitamin D  50,000 IU  RTC 4 months    Chronic migraine with aura without status migrainosus, not intractable: Patient reports experiencing 1-2 headaches per month. Headaches are described as throbbing and can be holocephalic or migratory. Associated symptoms include photophobia, phonophobia, and osmophobia. Patient confirms experiencing visual aura. Currently using Ajovy  for migraine prevention. She reports that Nurtec was previously helpful for abortive therapy.  Continue Ajovy   Prescribe Nurtec 8 pills monthly  Headache hygiene    Thank you for allowing me to participate  in your patient's care and please do not hesitate to contact me for any questions or concerns.    On the day of the encounter, a total of 40 minutes was spent on this patient encounter including review of historical information, examination, documentation and post-visit activities. The time documented excludes procedural time.    CANDIE Darlyn Free, DO  Assistant Professor of Neurology  [place]  [facility]    [id number]: I and/or [facility] Neurology provider will continue to be the provider focal point in managing the chronic complex neurological condition      =====================================================================    NAME:  Jamie Savage  DOB:  December 19, 1980  VISIT DATE:  05/02/2024     CC:  MS and migraine    Patient seen in consultation at the request of McClanahan, Camellia Hamilton,*   History obtained from the patient and chart/records  Age of patient:  43 y.o.    I had the pleasure of seeing Jamie Savage for outpatient consultation, who is a 43 y.o. year old female and is being seen for management of above CC.    INTERVAL: Since last visit, symptoms remain stable continues to have significant daily fatigue. She can sleep over 8 hours and still feel like she needs more throughout the day. She does snore but reports negative sleep study done by Dr. Sharron. No clearly new focal neurologic symptoms. She does report longstanding urinary urgency with some incontinence.     HPI:      12/27/2023  43 year old female presenting for follow-up of multiple sclerosis and migraine with aura. She was diagnosed with multiple sclerosis in 2020 and is currently on Copaxone  for management.  The patient reports experiencing migraines with varying locations, including frontal, occipital, and holocephalic regions. She describes the headaches as throbbing in nature and reports associated symptoms including visual aura of blurred vision, photophobia, phonophobia, and osmophobia. The patient experienced approximately  5-6 headaches per month prior to Ajovy  and now around 1-2.    Regarding her multiple sclerosis, the patient denies any clear relapses since her diagnosis. However, she reports episodes of extreme lethargy where she stays in bed or sleeps for a couple of weeks. She has not experienced sudden onset of symptoms such as inability to move parts of her body, new sensory changes, or difficulty urinating, though she does report urinary urgency. The patient is unsure if she has had any spinal lesions or if she has undergone a lumbar puncture for diagnosis.    The patient is currently taking Copaxone  for her multiple sclerosis and Ajovy  for migraine prevention. She reports previously taking Ritalin but is no longer on this medication. She is also taking vitamin D  supplements. The patient expresses interest in seeing a psychiatrist for further management of depression.    =====================================================================  PMHx  Patient Active Problem List   Diagnosis    Chronic sinusitis    Moderate major depression (CMS HCC)    Multiple sclerosis (CMS HCC)    Migraine    Routine medical exam    Vitamin D  deficiency     Past Surgical History:   Procedure Laterality Date    HX CHOLECYSTECTOMY      HX HIP REPLACEMENT      HX TONSILLECTOMY      SINUS SURGERY       Family Medical History:       Problem Relation (Age of Onset)    Breast Cancer Maternal Aunt    Cervical Cancer Mother    Diabetes Father    No Known Problems Sister, Brother, Maternal Grandmother, Maternal Grandfather, Paternal Grandmother, Paternal Grandfather, Daughter, Son, Maternal Uncle, Paternal Aunt, Paternal Uncle, Other    Stroke Mother, Father          Current Outpatient Medications   Medication Sig Dispense Refill    AJOVY  AUTOINJECTOR 225 mg/1.5 mL Subcutaneous Auto-Injector Inject 1.5 mL (225 mg total) under the skin Every 30 days 1.5 mL 2    cholecalciferol , vitamin D3, 1,250 mcg (50,000 unit) Oral Capsule Take 1 Capsule (50,000 Units  total) by mouth Every 7 days 4 Capsule 2    cyanocobalamin (VITAMIN B 12) 1,000 mcg Oral Tablet Take 1 Tablet (1,000 mcg total) by mouth Daily      dextroamphetamine -amphetamine  (ADDERALL) 5 mg Oral Tablet Take 1 Tablet (5 mg total) by mouth Every morning for 29 days 30 Tablet 0    [START ON 06/01/2024] dextroamphetamine -amphetamine  (ADDERALL) 5 mg Oral Tablet Take 1 Tablet (5 mg total) by mouth Every morning for 29 days 30 Tablet 0    [START ON 07/01/2024] dextroamphetamine -amphetamine  (ADDERALL) 5 mg Oral Tablet Take 1 Tablet (5 mg total) by mouth Every morning for 29 days 30 Tablet 0    EPINEPHrine  0.3 mg/0.3 mL Injection Auto-Injector Inject 0.3 mL (0.3 mg total) into the muscle Every 10 minutes as needed for ANAPHYLAXIS 1 Each 0    escitalopram  oxalate (LEXAPRO ) 20 mg Oral Tablet Take 1 Tablet (20 mg total) by mouth Daily      fluticasone  propionate (FLONASE ) 50 mcg/actuation Nasal Spray, Suspension Administer 1 Spray into each nostril Once a day 48 g 3    glatiramer  (COPAXONE ) 40 mg/mL Subcutaneous Syringe INJECT  1 SYRINGE UNDER THE SKIN 3 TIMES PER WEEK 12 Each 3    rimegepant (NURTEC ODT) 75 mg Oral Tablet, Rapid Dissolve Place 1 Tablet (75 mg total) under the tongue Every 24 hours as needed for Migraine Indications: a migraine headache 8 Tablet 2     No current facility-administered medications for this visit.     Allergies   Allergen Reactions    Grass Pollen  Other Adverse Reaction (Add comment)     Sneezing and watery eyes     Social History     Socioeconomic History    Marital status: Married     Spouse name: Selinda    Number of children: Not on file    Years of education: Not on file    Highest education level: Not on file   Occupational History    Not on file   Tobacco Use    Smoking status: Former     Current packs/day: 0.00     Types: Cigarettes     Quit date: 01/04/2022     Years since quitting: 2.3    Smokeless tobacco: Never   Vaping Use    Vaping status: Former   Substance and Sexual Activity     Alcohol use: Yes    Drug use: Yes     Types: Marijuana    Sexual activity: Not Currently   Other Topics Concern    Not on file   Social History Narrative    Not on file     Social Determinants of Health     Financial Resource Strain: Not on file   Transportation Needs: Not on file   Social Connections: Not on file   Intimate Partner Violence: Not on file   Housing Stability: Not on file       =====================================================================  GENERAL EXAMINATION  BP 134/81 (Site: Left Arm, Patient Position: Sitting)   Pulse 75   Temp 36.7 C (98 F) (Temporal)   Wt 106 kg (233 lb 9.6 oz)   SpO2 94%   BMI 38.87 kg/m     Vital signs personally reviewed    General: No acute distress, alert  HEENT: Normocephalic, no scleral icterus  Extremities: No significant edema, No cyanosis    NEUROLOGIC EXAM  MENTAL STATUS: alert and answering questions appropriately    CN  II: not directly tested, grossly intact  III, IV, VI: extraocular movements intact without nystagmus  V: intact to light touch  VII: face symmetric without weakness  VIII: grossly intact  IX, X: symmetric palatal elevation  XI: normal strength of trapezius and sternocleidomastoid bilaterally  XII: tongue midline with full movements    MOTOR  Bulk: normal  Abnormal Movements: none    Strength: 5/5 throughout    Reflexes: 3+ throughout with spread    Sensory: Intact to Light Touch     Coordination: mild dysmetria of RUE    Gait: Normal    =====================================================================  DATA  Personal review of prior labs is notable for:   VITAMIN B 12   Date Value Ref Range Status   08/18/2023 639 180 - 914 pg/mL Final     FOLATE   Date Value Ref Range Status   12/21/2021 8.3 5.9 - 24.4 ng/mL Final     VITAMIN D    Date Value Ref Range Status   01/19/2023 48 30 - 100 ng/mL Final     LDL CALC   Date Value Ref Range Status   08/18/2023 71 0 - 100 mg/dL Final  HEMOGLOBIN A1C   Date Value Ref Range Status    08/18/2023 5.9 4.0 - 6.0 % Final     TSH   Date Value Ref Range Status   08/18/2023 1.236 0.450 - 5.330 uIU/mL Final     Personal review of imaging (with independent interpretation) is notable for:   MRI Brain May 2025 - no suspicious lesions for MS   MRI Cervical Spine May 2025 - high cervical spine lesion       - MRI cervical spine August 2025 cord signal change C1-2    Personal Review of other prior diagnostics is notable for: not available for review   =====================================================================    Orders  Orders Placed This Encounter    DRUG SCREEN, NO CONFIRMATION, URINE    Referral to UROLOGY - [place] - [facility]    dextroamphetamine -amphetamine  (ADDERALL) 5 mg Oral Tablet    dextroamphetamine -amphetamine  (ADDERALL) 5 mg Oral Tablet    dextroamphetamine -amphetamine  (ADDERALL) 5 mg Oral Tablet

## 2024-05-04 MED ORDER — DEXTROAMPHETAMINE-AMPHETAMINE 5 MG TABLET
5.0000 mg | ORAL_TABLET | Freq: Every morning | ORAL | 0 refills | Status: AC
Start: 2024-05-04 — End: 2024-06-02

## 2024-05-04 NOTE — Addendum Note (Signed)
 Addended byBETHA SHERRE SENIOR on: 05/04/2024 10:44 AM     Modules accepted: Orders

## 2024-05-09 ENCOUNTER — Encounter (INDEPENDENT_AMBULATORY_CARE_PROVIDER_SITE_OTHER): Payer: Self-pay | Admitting: NEUROLOGY

## 2024-05-09 NOTE — Nursing Note (Signed)
 Called patient and she had appt with Ronal Estrin for depression

## 2024-05-14 ENCOUNTER — Other Ambulatory Visit (INDEPENDENT_AMBULATORY_CARE_PROVIDER_SITE_OTHER): Payer: Self-pay | Admitting: INTERNAL MEDICINE

## 2024-05-14 MED ORDER — ESCITALOPRAM 20 MG TABLET: 20.0000 mg | ORAL_TABLET | Freq: Every day | ORAL | 1 refills | Status: DC

## 2024-05-14 NOTE — Telephone Encounter (Signed)
 Patient asking for a refill of Lexapro  called to [facility] in [place]

## 2024-05-25 ENCOUNTER — Encounter (INDEPENDENT_AMBULATORY_CARE_PROVIDER_SITE_OTHER): Admitting: NEUROLOGY

## 2024-07-09 ENCOUNTER — Other Ambulatory Visit (INDEPENDENT_AMBULATORY_CARE_PROVIDER_SITE_OTHER): Payer: Self-pay | Admitting: INTERNAL MEDICINE

## 2024-07-09 ENCOUNTER — Telehealth (INDEPENDENT_AMBULATORY_CARE_PROVIDER_SITE_OTHER): Payer: Self-pay | Admitting: INTERNAL MEDICINE

## 2024-07-09 MED ORDER — AMOXICILLIN 875 MG-POTASSIUM CLAVULANATE 125 MG TABLET
1.0000 | ORAL_TABLET | Freq: Two times a day (BID) | ORAL | 0 refills | Status: AC
Start: 2024-07-09 — End: 2024-07-19

## 2024-07-09 NOTE — Telephone Encounter (Signed)
 Pt has an infection in her tooth. Requesting an abx before getting in to the dentist.

## 2024-07-16 ENCOUNTER — Other Ambulatory Visit (INDEPENDENT_AMBULATORY_CARE_PROVIDER_SITE_OTHER): Payer: Self-pay | Admitting: NEUROLOGY

## 2024-07-16 DIAGNOSIS — G35D Multiple sclerosis, unspecified: Secondary | ICD-10-CM

## 2024-07-16 NOTE — Telephone Encounter (Signed)
Medication was d/c. °

## 2024-07-30 ENCOUNTER — Other Ambulatory Visit (INDEPENDENT_AMBULATORY_CARE_PROVIDER_SITE_OTHER): Payer: Self-pay | Admitting: NEUROLOGY

## 2024-07-30 DIAGNOSIS — G35D Multiple sclerosis, unspecified: Secondary | ICD-10-CM

## 2024-08-15 ENCOUNTER — Ambulatory Visit (INDEPENDENT_AMBULATORY_CARE_PROVIDER_SITE_OTHER): Payer: Self-pay | Admitting: INTERNAL MEDICINE

## 2024-08-15 ENCOUNTER — Encounter (INDEPENDENT_AMBULATORY_CARE_PROVIDER_SITE_OTHER): Payer: Self-pay | Admitting: INTERNAL MEDICINE

## 2024-08-15 NOTE — Progress Notes (Deleted)
[facility] Internal Medicine  [address] [place], [place]  [address]  Phone: [phone] Fax: [phone]    Name: Jamie Savage                       Date of Birth: 11/25/1980   MRN:  [id number]                         Date of visit: 08/15/2024     Chief Complaint: No chief complaint on file.    Current Outpatient Medications   Medication Sig    AJOVY  AUTOINJECTOR 225 mg/1.5 mL Subcutaneous Auto-Injector Inject 1.5 mL (225 mg total) under the skin Every 30 days    cholecalciferol , vitamin D3, 1,250 mcg (50,000 unit) Oral Capsule Take 1 Capsule (50,000 Units total) by mouth Every 7 days    COPAXONE  40 mg/mL Subcutaneous Syringe INJECT 1 SYRINGE UNDER THE SKIN 3 TIMES PER WEEK    cyanocobalamin (VITAMIN B 12) 1,000 mcg Oral Tablet Take 1 Tablet (1,000 mcg total) by mouth Daily    EPINEPHrine  0.3 mg/0.3 mL Injection Auto-Injector Inject 0.3 mL (0.3 mg total) into the muscle Every 10 minutes as needed for ANAPHYLAXIS    escitalopram  oxalate (LEXAPRO ) 20 mg Oral Tablet Take 1 Tablet (20 mg total) by mouth Daily    fluticasone  propionate (FLONASE ) 50 mcg/actuation Nasal Spray, Suspension Administer 1 Spray into each nostril Once a day    rimegepant (NURTEC ODT) 75 mg Oral Tablet, Rapid Dissolve Place 1 Tablet (75 mg total) under the tongue Every 24 hours as needed for Migraine Indications: a migraine headache       Patient Active Problem List    Diagnosis Date Noted    Vitamin D  deficiency 12/27/2023    Chronic sinusitis 02/01/2023    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 And annual exam    Chronic Sinusitis  S/P sinus surgery  Was following with ENT, nothing since post op period  On Flonase     Depression  Was on Celexa, but sure scripts not showing any recent refills  Now on Lexapro   [facility].  But neither  appeared to have been done  Patient asking to see Ronal Idell [facility], [facility].   [phone] Referral made to listed provider    Migraine  On Ajovy  and Jurtec  Was following with Neurology    Multiple Sclerosis  Sx 2020  On Copaxone  - did finally receive  Dr Sharron has retired and patient has been referred to [facility]. However patient was not given enough refills on Copaxone  to cover until was seen by new neurologist. We had to get approved, which insurance seems resistant to do.   Dr Sharron was completing disability forms for the patient. She is not sure exactly when he started to do so. It has been more than 2 years since she was able to work.   Still has tremors and weakness as well as pain even with the medication. Occasional migraines and dizziness. Confusion.   Without the medication she has all the above, worse, as well as crippling fatigue and mood swings.   [id number] Saw Dr Sonna. Continued copaxone . Repeating brain MRI.   01-2024 MRI brain.  Normal-appearing.  Multiple small cystic lesions in right parotid gland could be evaluated CT scan of the neck with IV contrast  01-2024 MRI cervical spine.  Unremarkable cervical spine    ADHD  Was on Ritalin, prescribed by Dr Sharron  Last refill 08-17-23  04-2024 Dr Sherre resumed Adderall, but only filled once    Screening labs  08-18-2023 hemoglobin A1c 5.9  08/18/2023 total cholesterol 142, triglycerides 71, HDL 57, calculated LDL 71  08/18/2023 TSH 1.24  08/18/2023 vitamin B12 639  08/18/2023 vitamin-D 46  08/18/2023 normal CBC and CMP    Mammogram due    ROS:  10 systems reviewed and were negative except as noted.   + fatigue  + weakness    Objective :  There were no vitals taken for this visit.      General:  appears in good health, comfortable  Lungs:  Clear to auscultation bilaterally.   Cardiovascular:  regular rate and rhythm, S1, S2 normal, no murmur, click, rub or gallop  Neurologic:  Grossly normal. Alert and oriented x3  Psychiatric:   Normal    Data reviewed:  Neuro visit - trial of Adderall for fatigue, urology referral for urgency and incontinence    Assessment/Plan:  Assessment/Plan   1. Chronic maxillary sinusitis    2. Moderate major depression (CMS HCC)    3. Vitamin D  deficiency    4. Multiple sclerosis    5. Chronic migraine with aura without status migrainosus, not intractable        Chronic Sinusitis  Ok for flonase     Depression  Continue Lexapro   Pending referral to psych, will try to do new referral to other provider    Multiple sclerosis  Follow with neuro  Continue Copaxone     Migraine  Follow with neuro  Continue Ajovy  and Nurtec    Annual Exam  Age appropriate counseling provided    Jamie Glendia Broaden, DO, [facility]  Internal Medicine

## 2024-08-17 ENCOUNTER — Other Ambulatory Visit (INDEPENDENT_AMBULATORY_CARE_PROVIDER_SITE_OTHER): Payer: Self-pay | Admitting: INTERNAL MEDICINE

## 2024-08-17 ENCOUNTER — Other Ambulatory Visit (INDEPENDENT_AMBULATORY_CARE_PROVIDER_SITE_OTHER): Payer: Self-pay | Admitting: NEUROLOGY

## 2024-09-11 ENCOUNTER — Other Ambulatory Visit (INDEPENDENT_AMBULATORY_CARE_PROVIDER_SITE_OTHER): Payer: Self-pay | Admitting: NEUROLOGY

## 2024-09-11 ENCOUNTER — Other Ambulatory Visit: Payer: Self-pay

## 2024-09-11 ENCOUNTER — Encounter (INDEPENDENT_AMBULATORY_CARE_PROVIDER_SITE_OTHER): Payer: Self-pay | Admitting: NEUROLOGY

## 2024-09-11 ENCOUNTER — Ambulatory Visit (INDEPENDENT_AMBULATORY_CARE_PROVIDER_SITE_OTHER): Payer: Self-pay | Admitting: NEUROLOGY

## 2024-09-11 ENCOUNTER — Telehealth (INDEPENDENT_AMBULATORY_CARE_PROVIDER_SITE_OTHER): Payer: Self-pay | Admitting: INTERNAL MEDICINE

## 2024-09-11 VITALS — BP 139/85 | HR 94 | Temp 96.4°F | Wt 236.6 lb

## 2024-09-11 DIAGNOSIS — G35D Multiple sclerosis, unspecified: Secondary | ICD-10-CM

## 2024-09-11 DIAGNOSIS — G43E09 Chronic migraine with aura, not intractable, without status migrainosus: Secondary | ICD-10-CM

## 2024-09-11 MED ORDER — MODAFINIL 100 MG TABLET: 100.0000 mg | ORAL_TABLET | Freq: Every day | ORAL | 2 refills | Status: AC

## 2024-09-11 MED ORDER — COPAXONE 40 MG/ML SUBCUTANEOUS SYRINGE: INJECTION | SUBCUTANEOUS | 11 refills | Status: DC

## 2024-09-11 MED ORDER — SUMATRIPTAN 100 MG TABLET: 100.0000 mg | ORAL_TABLET | Freq: Once | ORAL | 2 refills | Status: AC | PRN

## 2024-09-11 MED ORDER — CHOLECALCIFEROL (VITAMIN D3) 1,250 MCG (50,000 UNIT) CAPSULE: 50000.0000 [IU] | ORAL_CAPSULE | ORAL | 2 refills | Status: AC

## 2024-09-11 NOTE — Progress Notes (Signed)
 NEUROLOGY, [facility]  [address]  [place] [address]      ASSESSMENT/PLAN  Multiple sclerosis (CMS [facility]): Patient was diagnosed with multiple sclerosis in 2020, likely relapsing-remitting type. She is currently on Copaxone  for disease-modifying therapy. No clear relapses reported, though patient describes episodes of lethargy lasting a couple of weeks. Neurological exam reveals hyperreflexia of bilateral upper and lower extremities, with left upper extremity worse than right, and cross adductor reflex present. 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  MRI of the brain is largely unremarkable which seems consistent with prior images. Her updated cervical spine image shows a lesion at C1-2 affecting the left side of the spinal cord. This was seen on imaging previously. One of her main concerns is excessive fatigues for which we will trial a stimulant. She has had a sleep study in the past which was reportedly negative for OSA. She is also having issues with urinary urgency and incontinence, she saw Urology but was dissatisfied with the treatment plan.  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 Headaches are described as throbbing and can be holocephalic or migratory. Associated symptoms include photophobia, phonophobia, and osmophobia. Patient confirms experiencing visual aura. Currently using Ajovy  for migraine prevention. She reports that Nurtec was previously helpful for abortive therapy.   Continue Ajovy   Begin Imitrex  100 mg PRN for severe  migraine  Will prescribe Nurtec if Imitrex  is ineffective  Headache hygiene    Thank you for allowing me to participate in your patient's care and please do not hesitate to contact me for any questions or concerns.    Garnette Hard, MD  Assistant Professor of Neurology  [facility]   On the day of the encounter, a total of 30 minutes was spent on this patient encounter including review of historical information, examination, documentation and post-visit activities. The time documented excludes procedural time.  [id number]: I will continue to be the provider focal point in managing the chronic complex neurological condition    =====================================================================    NAME:  Jamie Savage  DOB:  10/08/1980  VISIT DATE:  09/11/2024     CC:  MS and migraine    Patient seen in consultation at the request of McClanahan, Camellia Hamilton,*   History obtained from the patient and chart/records  Age of patient:  44 y.o.    I had the pleasure of seeing Jamie Savage for outpatient consultation, who is a 44 y.o. year old female and is being seen for management of above CC.    INTERVAL: 09/11/2024 44 year old female with multiple sclerosis and chronic migraine, presented for follow-up reporting significant fatigue and requesting medication refills. Her May MRI showed stable MS with no new lesions. She discontinued Adderall due to feeling like a zombie and experiences severe headaches several times monthly. Management included starting modafinil  100 mg daily for MS-related fatigue, initiating sumatriptan100 mgg PRN for migraines (after insurance denied Nurtec), continuing Copaxon40 mgmg, and maintaining weekly vitamin D  supplementation.    HPI:    05/02/24 Since last visit, symptoms remain stable continues  to have significant daily fatigue. She can sleep over 8 hours and still feel like she needs more throughout the day. She does snore but reports negative sleep study  done by Dr. Sharron. No clearly new focal neurologic symptoms. She does report longstanding urinary urgency with some incontinence.     12/27/2023  44 year old female presenting for follow-up of multiple sclerosis and migraine with aura. She was diagnosed with multiple sclerosis in 2020 and is currently on Copaxone  for management.    The patient reports experiencing migraines with varying locations, including frontal, occipital, and holocephalic regions. She describes the headaches as throbbing in nature and reports associated symptoms including visual aura of blurred vision, photophobia, phonophobia, and osmophobia. The patient experienced approximately 5-6 headaches per month prior to Ajovy  and now around 1-2.    Regarding her multiple sclerosis, the patient denies any clear relapses since her diagnosis. However, she reports episodes of extreme lethargy where she stays in bed or sleeps for a couple of weeks. She has not experienced sudden onset of symptoms such as inability to move parts of her body, new sensory changes, or difficulty urinating, though she does report urinary urgency. The patient is unsure if she has had any spinal lesions or if she has undergone a lumbar puncture for diagnosis.    The patient is currently taking Copaxone  for her multiple sclerosis and Ajovy  for migraine prevention. She reports previously taking Ritalin but is no longer on this medication. She is also taking vitamin D  supplements. The patient expresses interest in seeing a psychiatrist for further management of depression.    =====================================================================  PMHx  Patient Active Problem List   Diagnosis    Chronic sinusitis    Moderate major depression (CMS HCC)    Multiple sclerosis    Migraine    Routine medical exam    Vitamin D  deficiency     Past Surgical History:   Procedure Laterality Date    HX CHOLECYSTECTOMY      HX HIP REPLACEMENT      HX TONSILLECTOMY      SINUS SURGERY       Family  Medical History:       Problem Relation (Age of Onset)    Breast Cancer Maternal Aunt    Cervical Cancer Mother    Diabetes Father    No Known Problems Sister, Brother, Maternal Grandmother, Maternal Grandfather, Paternal Grandmother, Paternal Grandfather, Daughter, Son, Maternal Uncle, Paternal Aunt, Paternal Uncle, Other    Stroke Mother, Father          Current Outpatient Medications   Medication Sig Dispense Refill    AJOVY  AUTOINJECTOR 225 mg/1.5 mL Subcutaneous Auto-Injector ADMINISTER 1.5 ML(225 MG) UNDER THE SKIN EVERY 30 DAYS 1.5 mL 2    biotin 5 mg/mL Oral Suspension Take 1 mL (5 mg total) by mouth Daily      cholecalciferol , vitamin D3, 1,250 mcg (50,000 unit) Oral Capsule Take 1 Capsule (50,000 Units total) by mouth Every 7 days 4 Capsule 2    COPAXONE  40 mg/mL Subcutaneous Syringe INJECT 1 SYRINGE UNDER THE SKIN 3 TIMES PER WEEK 1 Each 3    cyanocobalamin (VITAMIN B 12) 1,000 mcg Oral Tablet Take 1 Tablet (1,000 mcg total) by mouth Daily      EPINEPHrine  0.3 mg/0.3 mL Injection Auto-Injector Inject 0.3 mL (0.3 mg total) into the muscle Every 10 minutes as needed for ANAPHYLAXIS 1 Each 0    escitalopram  oxalate (LEXAPRO ) 20 mg Oral Tablet TAKE 1 TABLET(20 MG) BY  MOUTH DAILY 30 Tablet 0    fluticasone  propionate (FLONASE ) 50 mcg/actuation Nasal Spray, Suspension Administer 1 Spray into each nostril Once a day 48 g 3    rimegepant (NURTEC ODT) 75 mg Oral Tablet, Rapid Dissolve Place 1 Tablet (75 mg total) under the tongue Every 24 hours as needed for Migraine Indications: a migraine headache 8 Tablet 2     No current facility-administered medications for this visit.     Allergies   Allergen Reactions    Grass Pollen  Other Adverse Reaction (Add comment)     Sneezing and watery eyes     Social History     Socioeconomic History    Marital status: Married     Spouse name: Selinda    Number of children: Not on file    Years of education: Not on file    Highest education level: Not on file   Occupational History     Not on file   Tobacco Use    Smoking status: Former     Current packs/day: 0.00     Types: Cigarettes     Quit date: 01/04/2022     Years since quitting: 2.6    Smokeless tobacco: Never   Vaping Use    Vaping status: Former   Substance and Sexual Activity    Alcohol use: Yes    Drug use: Yes     Types: Marijuana    Sexual activity: Not Currently   Other Topics Concern    Not on file   Social History Narrative    Not on file     Social Determinants of Health     Financial Resource Strain: Not on file   Transportation Needs: Not on file   Social Connections: Not on file   Intimate Partner Violence: Not on file   Housing Stability: Not on file       =====================================================================  GENERAL EXAMINATION  BP 139/85 (Site: Left Arm, Patient Position: Sitting, Cuff Size: Adult)   Pulse 94   Temp (!) 35.8 C (96.4 F) (Temporal)   Wt 107 kg (236 lb 9.6 oz)   SpO2 95%   BMI 39.37 kg/m     Vital signs personally reviewed    General: No acute distress, alert  HEENT: Normocephalic, no scleral icterus  Extremities: No significant edema, No cyanosis    NEUROLOGIC EXAM  MENTAL STATUS: alert and answering questions appropriately    CN  II: not directly tested, grossly intact  III, IV, VI: extraocular movements intact without nystagmus  V: intact to light touch  VII: face symmetric without weakness  VIII: grossly intact  IX, X: symmetric palatal elevation  XI: normal strength of trapezius and sternocleidomastoid bilaterally  XII: tongue midline with full movements    MOTOR  Bulk: normal  Abnormal Movements: none    Strength: 5/5 throughout    Reflexes: 3+ throughout with spread    Sensory: Intact to Light Touch     Coordination: mild dysmetria of RUE    Gait: Normal    =====================================================================  DATA  Personal review of prior labs is notable for:   VITAMIN B 12   Date Value Ref Range Status   08/18/2023 639 180 - 914 pg/mL Final     FOLATE   Date  Value Ref Range Status   12/21/2021 8.3 5.9 - 24.4 ng/mL Final     VITAMIN D    Date Value Ref Range Status   01/19/2023 48 30 - 100 ng/mL Final  LDL CALC   Date Value Ref Range Status   08/18/2023 71 0 - 100 mg/dL Final     HEMOGLOBIN J8R   Date Value Ref Range Status   08/18/2023 5.9 4.0 - 6.0 % Final     TSH   Date Value Ref Range Status   08/18/2023 1.236 0.450 - 5.330 uIU/mL Final     Personal review of imaging (with independent interpretation) is notable for:   MRI Brain May 2025 - no suspicious lesions for MS   MRI Cervical Spine May 2025 - high cervical spine lesion       - MRI cervical spine August 2025 cord signal change C1-2    Personal Review of other prior diagnostics is notable for: not available for review   =====================================================================    Orders  Orders Placed This Encounter    COPAXONE  40 mg/mL Subcutaneous Syringe    modafiniL  (PROVIGIL ) 100 mg Oral Tablet    sumatriptan  succinate (IMITREX ) 100 mg Oral Tablet    cholecalciferol , vitamin D3, 1,250 mcg (50,000 unit) Oral Capsule

## 2024-09-11 NOTE — Telephone Encounter (Signed)
 Patient is aware and she was actually at the neurologist office at the time of conversation.

## 2024-09-11 NOTE — Telephone Encounter (Signed)
 Jamie Savage is requesting a refill of her Copaxone  to be called in to to [facility].       She has scheduled a follow up appointment with you on 09/25/24.

## 2024-09-19 ENCOUNTER — Other Ambulatory Visit (INDEPENDENT_AMBULATORY_CARE_PROVIDER_SITE_OTHER): Payer: Self-pay | Admitting: NEUROLOGY

## 2024-09-19 MED ORDER — GLATIRAMER 40 MG/ML SUBCUTANEOUS SYRINGE: INJECTION | SUBCUTANEOUS | 3 refills | Status: DC

## 2024-09-25 ENCOUNTER — Ambulatory Visit (INDEPENDENT_AMBULATORY_CARE_PROVIDER_SITE_OTHER): Admitting: INTERNAL MEDICINE

## 2024-09-25 ENCOUNTER — Encounter (INDEPENDENT_AMBULATORY_CARE_PROVIDER_SITE_OTHER): Payer: Self-pay | Admitting: INTERNAL MEDICINE

## 2024-09-25 NOTE — Progress Notes (Deleted)
 [facility]  [address] [place], [place]  [address]  Phone: [phone] Fax: [phone]    Name: Jamie Savage                       Date of Birth: 1981-02-28   MRN:  [id number]                         Date of visit: 09/25/2024     Chief Complaint: No chief complaint on file.    Current Outpatient Medications   Medication Sig    AJOVY  AUTOINJECTOR 225 mg/1.5 mL Subcutaneous Auto-Injector ADMINISTER 1.5 ML(225 MG) UNDER THE SKIN EVERY 30 DAYS    biotin 5 mg/mL Oral Suspension Take 1 mL (5 mg total) by mouth Daily    cholecalciferol , vitamin D3, 1,250 mcg (50,000 unit) Oral Capsule Take 1 Capsule (50,000 Units total) by mouth Every 7 days    cyanocobalamin (VITAMIN B 12) 1,000 mcg Oral Tablet Take 1 Tablet (1,000 mcg total) by mouth Daily    EPINEPHrine  0.3 mg/0.3 mL Injection Auto-Injector Inject 0.3 mL (0.3 mg total) into the muscle Every 10 minutes as needed for ANAPHYLAXIS    escitalopram  oxalate (LEXAPRO ) 20 mg Oral Tablet TAKE 1 TABLET(20 MG) BY MOUTH DAILY    fluticasone  propionate (FLONASE ) 50 mcg/actuation Nasal Spray, Suspension Administer 1 Spray into each nostril Once a day    glatiramer  (COPAXONE ) 40 mg/mL Subcutaneous Syringe Inject 1 syrince sc under skin 3 times a week    modafiniL  (PROVIGIL ) 100 mg Oral Tablet Take 1 Tablet (100 mg total) by mouth Daily    sumatriptan  succinate (IMITREX ) 100 mg Oral Tablet Take 1 Tablet (100 mg total) by mouth Once, as needed for Migraine May repeat in 2 hours in needed       Patient Active Problem List    Diagnosis Date Noted    Vitamin D  deficiency 12/27/2023    Chronic sinusitis 02/01/2023    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     Chronic Sinusitis  S/P sinus surgery  Was following with ENT, nothing since post op period  On Flonase     Depression  Was on Celexa, but sure scripts not  showing any recent refills  Now on Lexapro   There are referrals to [facility] in chart.  But neither appeared to have been done  Patient asking to see [facility], who [facility] not [facility]. We attempted referral to requested provider, but were unable to obtain.     Migraine  On Ajovy  and Imitrex   Planning on Nurtec if Imitrex  failure  Was following with Neurology    Multiple Sclerosis  Sx 2020  On Copaxone   Saw Dr Jamie Savage until he retired.   Dr Jamie Savage was completing disability forms for the patient. She is not sure exactly when he started to do so. Functionally disabled since at least 2022.   Still has tremors and weakness as well as pain even with the medication. Occasional migraines and dizziness. Confusion.   Without the medication she has all the above, worse, as well as crippling fatigue and mood swings.   [id number] Saw Dr Jamie Savage. Continued copaxone . Repeating brain MRI.   01-2024 MRI brain.  Normal-appearing.  Multiple small cystic lesions  in right parotid gland could be evaluated CT scan of the neck with IV contrast  01-2024 MRI cervical spine.  Unremarkable cervical spine    ADHD  Was on Ritalin, prescribed by Dr Jamie Savage  Last refill 08-17-23  [phone] Dr Jamie Savage refilled Adderall  09-2024 Reports did not tolerate Adderall. Dr Jamie Savage trialing modafinil . Was approved 09-17-24 for 3 months.     Screening labs  08-18-2023 hemoglobin A1c 5.9  08/18/2023 total cholesterol 142, triglycerides 71, HDL 57, calculated LDL 71  08/18/2023 TSH 1.24  08/18/2023 vitamin B12 639  08/18/2023 vitamin-D 46  08/18/2023 normal CBC and CMP    ROS:  10 systems reviewed and were negative except as noted.   + fatigue  + weakness    Objective :  There were no vitals taken for this visit.      General:  appears in good health, comfortable  Lungs:  Clear to auscultation bilaterally.   Cardiovascular:  regular rate and rhythm, S1, S2 normal, no murmur, click, rub or gallop  Neurologic:  Grossly normal. Alert and oriented  x3  Psychiatric:  Normal    Data reviewed:    Assessment/Plan:  Assessment/Plan   1. Chronic maxillary sinusitis    2. Vitamin D  deficiency    3. Moderate major depression (CMS HCC)    4. Multiple sclerosis    5. Chronic migraine with aura without status migrainosus, not intractable        Chronic Sinusitis  Ok for flonase     Depression  Continue Lexapro   Can refer to another provider if needed    Multiple sclerosis  Follow with neuro    Migraine  Follow with neuro    Fatigue   Almost definitely secondary to multiple sclerosis   Follow up with neuro    Jamie Glendia Broaden, DO, [facility]  Internal Medicine

## 2024-09-28 ENCOUNTER — Other Ambulatory Visit (INDEPENDENT_AMBULATORY_CARE_PROVIDER_SITE_OTHER): Payer: Self-pay | Admitting: NEUROLOGY

## 2024-09-28 MED ORDER — GLATIRAMER 40 MG/ML SUBCUTANEOUS SYRINGE: INJECTION | SUBCUTANEOUS | 3 refills | Status: DC

## 2024-10-05 ENCOUNTER — Other Ambulatory Visit (INDEPENDENT_AMBULATORY_CARE_PROVIDER_SITE_OTHER): Payer: Self-pay | Admitting: NEUROLOGY

## 2024-10-05 MED ORDER — GLATIRAMER 40 MG/ML SUBCUTANEOUS SYRINGE: INJECTION | SUBCUTANEOUS | 3 refills | Status: AC

## 2025-01-09 ENCOUNTER — Encounter (INDEPENDENT_AMBULATORY_CARE_PROVIDER_SITE_OTHER): Payer: Self-pay
